# Patient Record
Sex: Female | Born: 1985 | Race: Black or African American | Hispanic: No | Marital: Married | Smoking: Current some day smoker
Health system: Southern US, Community
[De-identification: ages and names within clinical notes are randomized; demographics above are authoritative.]

## PROBLEM LIST (undated history)

## (undated) HISTORY — PX: GASTRIC BYPASS: SHX52

## (undated) HISTORY — PX: OTHER SURGICAL HISTORY: SHX169

---

## 2014-08-27 ENCOUNTER — Emergency Department (HOSPITAL_COMMUNITY)
Admission: EM | Admit: 2014-08-27 | Discharge: 2014-08-27 | Disposition: A | Discharge status: Home / Self Care | Payer: Self-pay | Attending: Emergency Medicine | Admitting: Emergency Medicine

## 2014-08-27 ENCOUNTER — Emergency Department (HOSPITAL_COMMUNITY): Payer: Self-pay

## 2014-08-27 ENCOUNTER — Encounter (HOSPITAL_COMMUNITY): Payer: Self-pay | Admitting: Radiology

## 2014-08-27 DIAGNOSIS — Y929 Unspecified place or not applicable: Secondary | ICD-10-CM | POA: Insufficient documentation

## 2014-08-27 DIAGNOSIS — H9319 Tinnitus, unspecified ear: Secondary | ICD-10-CM | POA: Insufficient documentation

## 2014-08-27 DIAGNOSIS — M791 Myalgia: Secondary | ICD-10-CM | POA: Insufficient documentation

## 2014-08-27 DIAGNOSIS — Y939 Activity, unspecified: Secondary | ICD-10-CM | POA: Insufficient documentation

## 2014-08-27 DIAGNOSIS — R5383 Other fatigue: Secondary | ICD-10-CM | POA: Insufficient documentation

## 2014-08-27 DIAGNOSIS — W2209XA Striking against other stationary object, initial encounter: Secondary | ICD-10-CM | POA: Insufficient documentation

## 2014-08-27 DIAGNOSIS — R61 Generalized hyperhidrosis: Secondary | ICD-10-CM | POA: Insufficient documentation

## 2014-08-27 DIAGNOSIS — I951 Orthostatic hypotension: Secondary | ICD-10-CM

## 2014-08-27 DIAGNOSIS — N390 Urinary tract infection, site not specified: Secondary | ICD-10-CM | POA: Insufficient documentation

## 2014-08-27 DIAGNOSIS — R531 Weakness: Secondary | ICD-10-CM | POA: Insufficient documentation

## 2014-08-27 DIAGNOSIS — R110 Nausea: Secondary | ICD-10-CM | POA: Insufficient documentation

## 2014-08-27 DIAGNOSIS — R42 Dizziness and giddiness: Secondary | ICD-10-CM | POA: Insufficient documentation

## 2014-08-27 DIAGNOSIS — Y999 Unspecified external cause status: Secondary | ICD-10-CM | POA: Insufficient documentation

## 2014-08-27 DIAGNOSIS — S0081XA Abrasion of other part of head, initial encounter: Secondary | ICD-10-CM | POA: Insufficient documentation

## 2014-08-27 DIAGNOSIS — S199XXA Unspecified injury of neck, initial encounter: Secondary | ICD-10-CM | POA: Insufficient documentation

## 2014-08-27 DIAGNOSIS — Z3202 Encounter for pregnancy test, result negative: Secondary | ICD-10-CM | POA: Insufficient documentation

## 2014-08-27 DIAGNOSIS — R55 Syncope and collapse: Secondary | ICD-10-CM | POA: Insufficient documentation

## 2014-08-27 DIAGNOSIS — R630 Anorexia: Secondary | ICD-10-CM | POA: Insufficient documentation

## 2014-08-27 LAB — CBC WITH DIFFERENTIAL/PLATELET
BASOS ABS: 0 10*3/uL (ref 0.0–0.1)
BASOS PCT: 0 % (ref 0–1)
EOS PCT: 0 % (ref 0–5)
Eosinophils Absolute: 0 10*3/uL (ref 0.0–0.7)
HEMATOCRIT: 37.3 % (ref 36.0–46.0)
HEMOGLOBIN: 11.7 g/dL — AB (ref 12.0–15.0)
Lymphocytes Relative: 17 % (ref 12–46)
Lymphs Abs: 1.4 10*3/uL (ref 0.7–4.0)
MCH: 24.3 pg — ABNORMAL LOW (ref 26.0–34.0)
MCHC: 31.4 g/dL (ref 30.0–36.0)
MCV: 77.5 fL — ABNORMAL LOW (ref 78.0–100.0)
Monocytes Absolute: 0.6 10*3/uL (ref 0.1–1.0)
Monocytes Relative: 7 % (ref 3–12)
Neutro Abs: 6.5 10*3/uL (ref 1.7–7.7)
Neutrophils Relative %: 76 % (ref 43–77)
Platelets: 309 10*3/uL (ref 150–400)
RBC: 4.81 MIL/uL (ref 3.87–5.11)
RDW: 14.8 % (ref 11.5–15.5)
WBC: 8.5 10*3/uL (ref 4.0–10.5)

## 2014-08-27 LAB — URINE MICROSCOPIC-ADD ON: RBC / HPF: NONE SEEN RBC/hpf (ref <3)

## 2014-08-27 LAB — COMPREHENSIVE METABOLIC PANEL
ALBUMIN: 3.7 g/dL (ref 3.5–5.0)
ALT: 21 U/L (ref 14–54)
ANION GAP: 15 (ref 5–15)
AST: 24 U/L (ref 15–41)
Alkaline Phosphatase: 64 U/L (ref 38–126)
CHLORIDE: 105 mmol/L (ref 101–111)
CO2: 20 mmol/L — ABNORMAL LOW (ref 22–32)
Calcium: 8.8 mg/dL — ABNORMAL LOW (ref 8.9–10.3)
Creatinine, Ser: 0.86 mg/dL (ref 0.44–1.00)
GFR calc Af Amer: >60 mL/min (ref >60)
GLUCOSE: 98 mg/dL (ref 65–99)
Potassium: 4.3 mmol/L (ref 3.5–5.1)
SODIUM: 140 mmol/L (ref 135–145)
Total Bilirubin: 0.4 mg/dL (ref 0.3–1.2)
Total Protein: 7.4 g/dL (ref 6.5–8.1)

## 2014-08-27 LAB — URINALYSIS, ROUTINE W REFLEX MICROSCOPIC
BILIRUBIN URINE: NEGATIVE
Glucose, UA: NEGATIVE mg/dL
Ketones, ur: 40 mg/dL — AB
Nitrite: POSITIVE — AB
Protein, ur: NEGATIVE mg/dL
Specific Gravity, Urine: 1.021 (ref 1.005–1.030)
UROBILINOGEN UA: 0.2 mg/dL (ref 0.0–1.0)
pH: 6 (ref 5.0–8.0)

## 2014-08-27 LAB — RAPID URINE DRUG SCREEN, HOSP PERFORMED
Amphetamines: NOT DETECTED
BARBITURATES: NOT DETECTED
Benzodiazepines: NOT DETECTED
Cocaine: NOT DETECTED
OPIATES: NOT DETECTED
TETRAHYDROCANNABINOL: NOT DETECTED

## 2014-08-27 LAB — ETHANOL: Alcohol, Ethyl (B): 116 mg/dL — ABNORMAL HIGH (ref <5)

## 2014-08-27 LAB — POC URINE PREG, ED: Preg Test, Ur: NEGATIVE

## 2014-08-27 LAB — TROPONIN I: Troponin I: <0.03 ng/mL (ref <0.031)

## 2014-08-27 MED ORDER — SODIUM CHLORIDE 0.9 % IV BOLUS (SEPSIS)
1000.0000 mL | Freq: Once | INTRAVENOUS | Status: AC
  Administered 2014-08-27: 1000 mL via INTRAVENOUS

## 2014-08-27 MED ORDER — METOCLOPRAMIDE HCL 5 MG/ML IJ SOLN
10.0000 mg | Freq: Once | INTRAMUSCULAR | Status: AC
  Administered 2014-08-27: 10 mg via INTRAVENOUS
  Filled 2014-08-27: qty 2

## 2014-08-27 MED ORDER — DEXTROSE 5 % IV SOLN
1.0000 g | Freq: Once | INTRAVENOUS | Status: AC
  Administered 2014-08-27: 1 g via INTRAVENOUS
  Filled 2014-08-27: qty 10

## 2014-08-27 MED ORDER — DIPHENHYDRAMINE HCL 50 MG/ML IJ SOLN
25.0000 mg | Freq: Once | INTRAMUSCULAR | Status: AC
  Administered 2014-08-27: 25 mg via INTRAVENOUS
  Filled 2014-08-27: qty 1

## 2014-08-27 MED ORDER — CEPHALEXIN 500 MG PO CAPS: 500.0000 mg | ORAL_CAPSULE | Freq: Four times a day (QID) | ORAL | Status: DC

## 2014-08-27 NOTE — Discharge Instructions (Signed)
Near-Syncope °Keep yourself hydrated. Follow up with your doctor. Return to the ED if you develop new or worsening symptoms. °Near-syncope (commonly known as near fainting) is sudden weakness, dizziness, or feeling like you might pass out. During an episode of near-syncope, you may also develop pale skin, have tunnel vision, or feel sick to your stomach (nauseous). Near-syncope may occur when getting up after sitting or while standing for a long time. It is caused by a sudden decrease in blood flow to the brain. This decrease can result from various causes or triggers, most of which are not serious. However, because near-syncope can sometimes be a sign of something serious, a medical evaluation is required. The specific cause is often not determined. °HOME CARE INSTRUCTIONS  °Monitor your condition for any changes. The following actions may help to alleviate any discomfort you are experiencing: °· Have someone stay with you until you feel stable. °· Lie down right away and prop your feet up if you start feeling like you might faint. Breathe deeply and steadily. Wait until all the symptoms have passed. Most of these episodes last only a few minutes. You may feel tired for several hours.   °· Drink enough fluids to keep your urine clear or pale yellow.   °· If you are taking blood pressure or heart medicine, get up slowly when seated or lying down. Take several minutes to sit and then stand. This can reduce dizziness. °· Follow up with your health care provider as directed.  °SEEK IMMEDIATE MEDICAL CARE IF:  °· You have a severe headache.   °· You have unusual pain in the chest, abdomen, or back.   °· You are bleeding from the mouth or rectum, or you have black or tarry stool.   °· You have an irregular or very fast heartbeat.   °· You have repeated fainting or have seizure-like jerking during an episode.   °· You faint when sitting or lying down.   °· You have confusion.   °· You have difficulty walking.   °· You  have severe weakness.   °· You have vision problems.   °MAKE SURE YOU:  °· Understand these instructions. °· Will watch your condition. °· Will get help right away if you are not doing well or get worse. °Document Released: 03/19/2005 Document Revised: 03/24/2013 Document Reviewed: 08/22/2012 °[employer]® Patient Information ©2015 [employer]. This information is not intended to replace advice given to you by your health care provider. Make sure you discuss any questions you have with your health care provider. ° °

## 2014-08-27 NOTE — ED Notes (Signed)
Patient transported to CT 

## 2014-08-27 NOTE — ED Notes (Signed)
SHE GOT VERY DIZZY WHEN SHE STOOD UP FOR ORTHOSTATS.  I HAD TO SIT HER BACK DOWN BEFORE THE PRESSURE FINISHED TAKING.
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
## 2014-08-27 NOTE — ED Notes (Signed)
Pt ambulated to bedside commode. Pt c/o dizziness.

## 2014-08-27 NOTE — ED Notes (Signed)
MD at bedside. 
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
## 2014-08-27 NOTE — ED Notes (Signed)
Pt given fluids and tolerated.

## 2014-08-27 NOTE — ED Notes (Signed)
Pt returned from CT °

## 2014-08-27 NOTE — ED Provider Notes (Signed)
CSN: [id number]     Arrival date & time 08/27/14  1057 History   First MD Initiated Contact with Patient 08/27/14 1105     Chief Complaint  Patient presents with  . Loss of Consciousness     (Consider location/radiation/quality/duration/timing/severity/associated sxs/prior Treatment) HPI Comments: Patient presents by EMS with episode of syncope that occurred around 10 AM. She went to work at [employer] and she was standing at the computer when she had some nausea, lightheadedness, tunnel vision and ringing in her ears. She then fell to her knees and reportedly lost consciousness. She denies hitting her head did strike her chin on the countertop. She denies any chest pain or shortness of breath. On EMS arrival, patient was pale and diaphoretic with blood pressure of 80 systolic. Patient endorses drinking some alcohol last night but states she's been eating and drinking normally. Denies any fevers or chills. No vomiting. No diarrhea. States she has passed out previously and diagnosed with vasovagal syncope. Also states that she had a miscarriage in April at [redacted] weeks gestation and has had ongoing vaginal bleeding since then. She denies any abdominal pain or pelvic pain currently.  Patient states she's had a "migraine" for the past 3 days which actually felt better this morning. She denies sudden worsening of the headache prior to syncope today. Headache gradually onset 3 days ago was associated with photophobia and nausea and vomiting yesterday. It improved with ibuprofen.  The history is provided by the patient and the EMS personnel. The history is limited by the condition of the patient.    History reviewed. No pertinent past medical history. History reviewed. No pertinent past surgical history. History reviewed. No pertinent family history. History  Substance Use Topics  . Smoking status: Not on file  . Smokeless tobacco: Not on file  . Alcohol Use: Not on file   OB History    No data  available     Review of Systems  Constitutional: Positive for activity change, appetite change and fatigue. Negative for fever.  HENT: Negative for congestion and rhinorrhea.   Eyes: Negative for visual disturbance.  Respiratory: Negative for cough, chest tightness and shortness of breath.   Cardiovascular: Positive for syncope. Negative for chest pain.  Gastrointestinal: Positive for nausea. Negative for vomiting and abdominal pain.  Genitourinary: Negative for dysuria, hematuria, vaginal bleeding and vaginal discharge.  Musculoskeletal: Positive for myalgias and arthralgias. Negative for back pain.  Skin: Negative for wound.  Neurological: Positive for dizziness, weakness and light-headedness. Negative for headaches.  A complete 10 system review of systems was obtained and all systems are negative except as noted in the HPI and PMH.      Allergies  Naprosyn  Home Medications   Prior to Admission medications   Medication Sig Start Date End Date Taking? Authorizing Provider  aspirin-acetaminophen-caffeine (EXCEDRIN MIGRAINE) [phone] MG per tablet Take 2 tablets by mouth every 6 (six) hours as needed for headache.   Yes Historical Provider, MD  ibuprofen (ADVIL,MOTRIN) 800 MG tablet Take 800 mg by mouth every 8 (eight) hours as needed (pain).   Yes Historical Provider, MD  cephALEXin (KEFLEX) 500 MG capsule Take 1 capsule (500 mg total) by mouth 4 (four) times daily. 08/27/14   Glynn OctaveStephen Sanjeev Main, MD   BP 105/53 mmHg  Pulse 94  Temp(Src) 98.7 F (37.1 C) (Oral)  Resp 26  SpO2 99%  LMP 08/27/2014 Physical Exam  Constitutional: She is oriented to person, place, and time. She appears well-developed and well-nourished.  No distress.  HENT:  Head: Normocephalic and atraumatic.  Mouth/Throat: Oropharynx is clear and moist. No oropharyngeal exudate.  Abrasion to chin. No trismus. No malocclusion.  Eyes: Conjunctivae and EOM are normal. Pupils are equal, round, and reactive to light.   Neck: Normal range of motion. Neck supple.  Diffuse paraspinal neck tenderness  Cardiovascular: Normal rate, regular rhythm, normal heart sounds and intact distal pulses.   No murmur heard. Pulmonary/Chest: Effort normal and breath sounds normal. No respiratory distress.  Abdominal: Soft. There is no tenderness. There is no rebound and no guarding.  Musculoskeletal: Normal range of motion. She exhibits no edema or tenderness.  Neurological: She is alert and oriented to person, place, and time. No cranial nerve deficit. She exhibits normal muscle tone. Coordination normal.  No ataxia on finger to nose bilaterally. No pronator drift. 4/5 strength throughout with poor effort. CN 2-12 intact.  Equal grip strength. Sensation intact.   Skin: Skin is warm.  Psychiatric: She has a normal mood and affect. Her behavior is normal.  Nursing note and vitals reviewed.   ED Course  Procedures (including critical care time) Labs Review Labs Reviewed  URINALYSIS, ROUTINE W REFLEX MICROSCOPIC (NOT AT [facility]) - Abnormal; Notable for the following:    Color, Urine AMBER (*)    APPearance CLOUDY (*)    Hgb urine dipstick LARGE (*)    Ketones, ur 40 (*)    Nitrite POSITIVE (*)    Leukocytes, UA SMALL (*)    All other components within normal limits  CBC WITH DIFFERENTIAL/PLATELET - Abnormal; Notable for the following:    Hemoglobin 11.7 (*)    MCV 77.5 (*)    MCH 24.3 (*)    All other components within normal limits  COMPREHENSIVE METABOLIC PANEL - Abnormal; Notable for the following:    CO2 20 (*)    BUN <5 (*)    Calcium 8.8 (*)    All other components within normal limits  ETHANOL - Abnormal; Notable for the following:    Alcohol, Ethyl (B) 116 (*)    All other components within normal limits  URINE MICROSCOPIC-ADD ON - Abnormal; Notable for the following:    Squamous Epithelial / LPF MANY (*)    Bacteria, UA MANY (*)    All other components within normal limits  URINE CULTURE  TROPONIN I   URINE RAPID DRUG SCREEN (HOSP PERFORMED) NOT AT [facility]  POC URINE PREG, ED    Imaging Review Ct Head Wo Contrast  08/27/2014   CLINICAL DATA:  Syncope.  Fall.  EXAM: CT HEAD WITHOUT CONTRAST  CT MAXILLOFACIAL WITHOUT CONTRAST  CT CERVICAL SPINE WITHOUT CONTRAST  TECHNIQUE: Multidetector CT imaging of the head, cervical spine, and maxillofacial structures were performed using the standard protocol without intravenous contrast. Multiplanar CT image reconstructions of the cervical spine and maxillofacial structures were also generated.  COMPARISON:  None.  FINDINGS: CT HEAD FINDINGS  No acute cortical infarct, hemorrhage, or mass lesion ispresent. Ventricles are of normal size. No significant extra-axial fluid collection is present. The paranasal sinuses andmastoid air cells are clear. The osseous skull is intact.  CT MAXILLOFACIAL FINDINGS  The paranasal sinuses are clear. The orbits are intact. No evidence for blow-out fracture. The mandible appears located and intact. The maxilla is also located and intact. The nasal bone is intact in the nasal septum is midline.  CT CERVICAL SPINE FINDINGS  Reversal of normal cervical lordosis. The cervical vertebral body heights are well preserved. The facet joints  are all well aligned. There is no fracture or subluxation identified.  IMPRESSION: 1. No acute intracranial abnormality. 2. No evidence for cervical spine fracture or dislocation. 3. Negative for facial bone injury.   Electronically Signed   By: Signa Kell M.D.   On: 08/27/2014 13:18   Ct Cervical Spine Wo Contrast  08/27/2014   CLINICAL DATA:  Syncope.  Fall.  EXAM: CT HEAD WITHOUT CONTRAST  CT MAXILLOFACIAL WITHOUT CONTRAST  CT CERVICAL SPINE WITHOUT CONTRAST  TECHNIQUE: Multidetector CT imaging of the head, cervical spine, and maxillofacial structures were performed using the standard protocol without intravenous contrast. Multiplanar CT image reconstructions of the cervical spine and maxillofacial  structures were also generated.  COMPARISON:  None.  FINDINGS: CT HEAD FINDINGS  No acute cortical infarct, hemorrhage, or mass lesion ispresent. Ventricles are of normal size. No significant extra-axial fluid collection is present. The paranasal sinuses andmastoid air cells are clear. The osseous skull is intact.  CT MAXILLOFACIAL FINDINGS  The paranasal sinuses are clear. The orbits are intact. No evidence for blow-out fracture. The mandible appears located and intact. The maxilla is also located and intact. The nasal bone is intact in the nasal septum is midline.  CT CERVICAL SPINE FINDINGS  Reversal of normal cervical lordosis. The cervical vertebral body heights are well preserved. The facet joints are all well aligned. There is no fracture or subluxation identified.  IMPRESSION: 1. No acute intracranial abnormality. 2. No evidence for cervical spine fracture or dislocation. 3. Negative for facial bone injury.   Electronically Signed   By: Signa Kell M.D.   On: 08/27/2014 13:18   Ct Maxillofacial Wo Cm  08/27/2014   CLINICAL DATA:  Syncope.  Fall.  EXAM: CT HEAD WITHOUT CONTRAST  CT MAXILLOFACIAL WITHOUT CONTRAST  CT CERVICAL SPINE WITHOUT CONTRAST  TECHNIQUE: Multidetector CT imaging of the head, cervical spine, and maxillofacial structures were performed using the standard protocol without intravenous contrast. Multiplanar CT image reconstructions of the cervical spine and maxillofacial structures were also generated.  COMPARISON:  None.  FINDINGS: CT HEAD FINDINGS  No acute cortical infarct, hemorrhage, or mass lesion ispresent. Ventricles are of normal size. No significant extra-axial fluid collection is present. The paranasal sinuses andmastoid air cells are clear. The osseous skull is intact.  CT MAXILLOFACIAL FINDINGS  The paranasal sinuses are clear. The orbits are intact. No evidence for blow-out fracture. The mandible appears located and intact. The maxilla is also located and intact. The nasal  bone is intact in the nasal septum is midline.  CT CERVICAL SPINE FINDINGS  Reversal of normal cervical lordosis. The cervical vertebral body heights are well preserved. The facet joints are all well aligned. There is no fracture or subluxation identified.  IMPRESSION: 1. No acute intracranial abnormality. 2. No evidence for cervical spine fracture or dislocation. 3. Negative for facial bone injury.   Electronically Signed   By: Signa Kell M.D.   On: 08/27/2014 13:18     EKG Interpretation   Date/Time:  Friday Aug 27 2014 11:13:35 EDT Ventricular Rate:  81 PR Interval:  165 QRS Duration: 73 QT Interval:  396 QTC Calculation: 460 R Axis:   46 Text Interpretation:  Sinus rhythm No significant change was found  Confirmed by Manus Gunning  MD, Slayton Lubitz [phone]) on 08/27/2014 11:27:14 AM      MDM   Final diagnoses:  Orthostatic hypotension  Syncope, unspecified syncope type  Urinary tract infection without hematuria, site unspecified   Syncopal episode preceded by lightheadedness  and dizziness. Hypotensive on EMS arrival. Now hypertensive.  Orthostatics positive. Heart rate elevates to 100 with standing.  hemoglobin 11.7 without comparison. HCG negative.  UA with infection.  Culture sent.  EKG nsr. No brugada or prolonged QT.  CT head and C spine negative.  Suspect vasovagal syncope in setting of alcohol use.  IVF and PO fluids given. Treat UTI.  Patient tolerating PO.  Continue IVF and aggressive hydration.  Dr. Madilyn Hook to disposition once able to ambulate.   Glynn Octave, MD 08/27/14 [phone]

## 2014-08-30 LAB — URINE CULTURE: Colony Count: >=100000

## 2014-08-31 ENCOUNTER — Telehealth (HOSPITAL_BASED_OUTPATIENT_CLINIC_OR_DEPARTMENT_OTHER): Payer: Self-pay | Admitting: Emergency Medicine

## 2014-08-31 NOTE — Telephone Encounter (Signed)
Post ED Visit - Positive Culture Follow-up  Culture report reviewed by antimicrobial stewardship pharmacist: []  Wes Dulaney, Pharm.D., BCPS []  Celedonio MiyamotoJeremy Frens, Pharm.D., BCPS []  Georgina PillionElizabeth Martin, [address] BCPS []  [place]fordMinh Pham, [address] BCPS, AAHIVP []  Estella HuskMichelle Turner, Pharm.D., BCPS, AAHIVP []  Elder CyphersLorie Poole, [address] BCPS X Tegan Magsam pharm D Positive urine  Culture Klebsiella Treated with cephalexin, organism sensitive to the same and no further patient follow-up is required at this time.  Berle MullMiller, Cicilia Clinger 08/31/2014, 1:58 PM

## 2015-09-19 ENCOUNTER — Other Ambulatory Visit (HOSPITAL_COMMUNITY): Payer: Self-pay | Admitting: Obstetrics and Gynecology

## 2015-09-19 DIAGNOSIS — Z3141 Encounter for fertility testing: Secondary | ICD-10-CM

## 2015-09-26 ENCOUNTER — Ambulatory Visit (HOSPITAL_COMMUNITY): Payer: Self-pay

## 2017-04-08 ENCOUNTER — Other Ambulatory Visit: Payer: Self-pay

## 2017-04-08 ENCOUNTER — Emergency Department (HOSPITAL_COMMUNITY)
Admission: EM | Admit: 2017-04-08 | Discharge: 2017-04-09 | Disposition: A | Discharge status: Home / Self Care | Payer: BLUE CROSS/BLUE SHIELD | Attending: Emergency Medicine | Admitting: Emergency Medicine

## 2017-04-08 ENCOUNTER — Encounter (HOSPITAL_COMMUNITY): Payer: Self-pay | Admitting: Emergency Medicine

## 2017-04-08 ENCOUNTER — Emergency Department (HOSPITAL_COMMUNITY): Payer: BLUE CROSS/BLUE SHIELD

## 2017-04-08 DIAGNOSIS — F419 Anxiety disorder, unspecified: Secondary | ICD-10-CM

## 2017-04-08 DIAGNOSIS — F17210 Nicotine dependence, cigarettes, uncomplicated: Secondary | ICD-10-CM | POA: Insufficient documentation

## 2017-04-08 DIAGNOSIS — R251 Tremor, unspecified: Secondary | ICD-10-CM

## 2017-04-08 DIAGNOSIS — R112 Nausea with vomiting, unspecified: Secondary | ICD-10-CM | POA: Diagnosis not present

## 2017-04-08 DIAGNOSIS — R197 Diarrhea, unspecified: Secondary | ICD-10-CM | POA: Diagnosis not present

## 2017-04-08 DIAGNOSIS — R1012 Left upper quadrant pain: Secondary | ICD-10-CM | POA: Diagnosis not present

## 2017-04-08 DIAGNOSIS — K760 Fatty (change of) liver, not elsewhere classified: Secondary | ICD-10-CM

## 2017-04-08 DIAGNOSIS — F1010 Alcohol abuse, uncomplicated: Secondary | ICD-10-CM | POA: Insufficient documentation

## 2017-04-08 LAB — CBC
HCT: 37.1 % (ref 36.0–46.0)
Hemoglobin: 12.2 g/dL (ref 12.0–15.0)
MCH: 26.4 pg (ref 26.0–34.0)
MCHC: 32.9 g/dL (ref 30.0–36.0)
MCV: 80.3 fL (ref 78.0–100.0)
PLATELETS: 349 10*3/uL (ref 150–400)
RBC: 4.62 MIL/uL (ref 3.87–5.11)
RDW: 16 % — AB (ref 11.5–15.5)
WBC: 11.2 10*3/uL — AB (ref 4.0–10.5)

## 2017-04-08 LAB — ETHANOL

## 2017-04-08 LAB — URINALYSIS, ROUTINE W REFLEX MICROSCOPIC
BILIRUBIN URINE: NEGATIVE
GLUCOSE, UA: NEGATIVE mg/dL
Ketones, ur: NEGATIVE mg/dL
NITRITE: NEGATIVE
PH: 7 (ref 5.0–8.0)
Protein, ur: NEGATIVE mg/dL
SPECIFIC GRAVITY, URINE: 1.011 (ref 1.005–1.030)

## 2017-04-08 LAB — COMPREHENSIVE METABOLIC PANEL
ALK PHOS: 65 U/L (ref 38–126)
ALT: 21 U/L (ref 14–54)
AST: 30 U/L (ref 15–41)
Albumin: 3.8 g/dL (ref 3.5–5.0)
Anion gap: 11 (ref 5–15)
BUN: 6 mg/dL (ref 6–20)
CALCIUM: 8.6 mg/dL — AB (ref 8.9–10.3)
CO2: 21 mmol/L — ABNORMAL LOW (ref 22–32)
CREATININE: 0.66 mg/dL (ref 0.44–1.00)
Chloride: 98 mmol/L — ABNORMAL LOW (ref 101–111)
Glucose, Bld: 112 mg/dL — ABNORMAL HIGH (ref 65–99)
Potassium: 3.5 mmol/L (ref 3.5–5.1)
Sodium: 130 mmol/L — ABNORMAL LOW (ref 135–145)
Total Bilirubin: 0.8 mg/dL (ref 0.3–1.2)
Total Protein: 7.5 g/dL (ref 6.5–8.1)

## 2017-04-08 LAB — RAPID URINE DRUG SCREEN, HOSP PERFORMED
AMPHETAMINES: NOT DETECTED
Barbiturates: NOT DETECTED
Benzodiazepines: NOT DETECTED
Cocaine: NOT DETECTED
OPIATES: NOT DETECTED
Tetrahydrocannabinol: NOT DETECTED

## 2017-04-08 LAB — I-STAT BETA HCG BLOOD, ED (MC, WL, AP ONLY): I-stat hCG, quantitative: <5 m[IU]/mL (ref <5)

## 2017-04-08 LAB — LIPASE, BLOOD: Lipase: 28 U/L (ref 11–51)

## 2017-04-08 MED ORDER — LORAZEPAM 2 MG/ML IJ SOLN: 1.0000 mg | Freq: Once | INTRAMUSCULAR | Status: DC

## 2017-04-08 MED ORDER — ONDANSETRON HCL 4 MG/2ML IJ SOLN
4.0000 mg | Freq: Once | INTRAMUSCULAR | Status: AC
Start: 2017-04-08 — End: 2017-04-08
  Administered 2017-04-08: 4 mg via INTRAVENOUS
  Filled 2017-04-08: qty 2

## 2017-04-08 MED ORDER — LORAZEPAM 1 MG PO TABS: 0.0000 mg | ORAL_TABLET | Freq: Four times a day (QID) | ORAL | Status: DC

## 2017-04-08 MED ORDER — SODIUM CHLORIDE 0.9 % IV BOLUS (SEPSIS)
1000.0000 mL | Freq: Once | INTRAVENOUS | Status: AC
  Administered 2017-04-08: 1000 mL via INTRAVENOUS

## 2017-04-08 MED ORDER — LORAZEPAM 2 MG/ML IJ SOLN
1.0000 mg | Freq: Once | INTRAMUSCULAR | Status: AC
  Administered 2017-04-08: 1 mg via INTRAVENOUS
  Filled 2017-04-08: qty 1

## 2017-04-08 MED ORDER — THIAMINE HCL 100 MG/ML IJ SOLN: 100.0000 mg | Freq: Every day | INTRAMUSCULAR | Status: DC

## 2017-04-08 MED ORDER — LORAZEPAM 2 MG/ML IJ SOLN
0.0000 mg | Freq: Four times a day (QID) | INTRAMUSCULAR | Status: DC
  Administered 2017-04-08: 2 mg via INTRAVENOUS
  Filled 2017-04-08: qty 1

## 2017-04-08 MED ORDER — IOPAMIDOL (ISOVUE-300) INJECTION 61%
100.0000 mL | Freq: Once | INTRAVENOUS | Status: AC | PRN
  Administered 2017-04-08: 100 mL via INTRAVENOUS

## 2017-04-08 MED ORDER — IOPAMIDOL (ISOVUE-300) INJECTION 61%
INTRAVENOUS | Status: AC
  Filled 2017-04-08: qty 100

## 2017-04-08 MED ORDER — LORAZEPAM 2 MG/ML IJ SOLN: 0.0000 mg | Freq: Two times a day (BID) | INTRAMUSCULAR | Status: DC

## 2017-04-08 MED ORDER — VITAMIN B-1 100 MG PO TABS: 100.0000 mg | ORAL_TABLET | Freq: Every day | ORAL | Status: DC

## 2017-04-08 MED ORDER — ONDANSETRON HCL 4 MG/2ML IJ SOLN
4.0000 mg | Freq: Once | INTRAMUSCULAR | Status: AC
  Administered 2017-04-08: 4 mg via INTRAVENOUS
  Filled 2017-04-08: qty 2

## 2017-04-08 MED ORDER — LORAZEPAM 1 MG PO TABS: 0.0000 mg | ORAL_TABLET | Freq: Two times a day (BID) | ORAL | Status: DC

## 2017-04-08 NOTE — ED Notes (Signed)
ED Provider at bedside. 

## 2017-04-08 NOTE — ED Provider Notes (Signed)
[facility]   CSN: [id number] Arrival date & time: 04/08/17  1930     History   Chief Complaint Chief Complaint  Patient presents with  . Delirium Tremens (DTS)  . Alcohol Problem  . Depression    HPI Holly Collier is a 32 y.o. female.  HPI   32 y/o female presents to the ED today accompanied by fiance with a chief complaint of DTs. Pt states she has a h/o intermittent ETOH abuse since 03-21-2012 when her mother passed away. Most recent episode of drinking started on 1/3 due to increased stress about her mother's passing and family issues that were causing her to have panic attacks. Has been drinking 4-5 bottles of wine a day since then. Has not had a drink since yesterday. Today is c/o tremulousness, diffuse HA, and palpitations. Pt also reports LUQ abd pain that has been intermittent over the last several days and is now constant. She also reports nausea and diarrhea for several days. Started vomiting today and has had about 5 episodes. States she has not had a full meal since 1/5 and has decreased intake of liquids as well.   Pt fell and hit head on a coffee table a few days ago. Fall was witnessed by fiance and he states pt did not lose consciousness. Pt denies any other injuries from the fall. Pt denies SI/HI, hallucinations, vision changes, chest pain, SOB, blood in stool, back pain, neck pain, or urinary sxs. Pt currently menstruating.   Pts fiance reports a possible seizure that occurred in the waiting room when pt lost consciousness for about 1 minute and began shaking. She regained consciousness and was mildly groggy, but returned to her baseline mental state after about 5 minutes. Pt denies tongue biting or urinary incontinence. She reports a prodrome of lightheadedness and tinnitus prior to the episode.  She denies a h/o DTs or seizures from ETOH withdrawal. Not currently being tx for anxiety/depression. No calf swelling,  redness, pain. No h/o hormone use, recent surgeries, prolonged immobility. No personal or family h/o VTE. No personal h/o CA.   History reviewed. No pertinent past medical history.  There are no active problems to display for this patient.   Past Surgical History:  Procedure Laterality Date  . GASTRIC BYPASS    . shoulder Right     OB History    No data available       Home Medications    Prior to Admission medications   Medication Sig Start Date End Date Taking? Authorizing Provider  aspirin-acetaminophen-caffeine (EXCEDRIN MIGRAINE) [phone] MG per tablet Take 2 tablets by mouth every 6 (six) hours as needed for headache.   Yes [provider]  ibuprofen (ADVIL,MOTRIN) 800 MG tablet Take 800 mg by mouth every 8 (eight) hours as needed (pain).   Yes [provider]  LORazepam (ATIVAN) 1 MG tablet Take 1 tablet (1 mg total) by mouth every 4 (four) hours as needed for anxiety (Take 1 tablet every 3-4 hours as needed for anxiety or tremors). 04/09/17   Starlette Thurow S, PA-C    Family History History reviewed. No pertinent family history.  Social History Social History   Tobacco Use  . Smoking status: Current Some Day Smoker    Packs/day: 0.50    Types: Cigarettes  . Smokeless tobacco: Never Used  Substance Use Topics  . Alcohol use: Yes    Comment: 5-6 bottles of wine  . Drug use: No  Allergies   Naprosyn [naproxen]   Review of Systems Review of Systems  Constitutional: Positive for chills. Negative for fever.       Tremulousness  HENT: Negative for ear pain and sore throat.   Eyes: Negative for pain and visual disturbance.  Respiratory: Negative for cough, shortness of breath and wheezing.   Cardiovascular: Negative for chest pain and palpitations.  Gastrointestinal: Positive for abdominal pain, diarrhea, nausea and vomiting. Negative for constipation.  Genitourinary: Positive for vaginal bleeding. Negative for dysuria, flank pain,  frequency, hematuria and urgency.  Musculoskeletal: Negative for arthralgias, back pain and neck pain.       No bilat calf pain, swelling, redness  Skin: Negative for color change and rash.  Neurological: Positive for light-headedness and headaches.       LOC  Psychiatric/Behavioral: Positive for dysphoric mood. Negative for hallucinations and self-injury. The patient is nervous/anxious.   All other systems reviewed and are negative.    Physical Exam Updated Vital Signs BP 121/78 (BP Location: Left Arm)   Pulse (!) 107   Temp 98.8 F (37.1 C) (Oral)   Resp (!) 28   Ht 5\' 8"  (1.727 m)   Wt (!) 144.7 kg (319 lb)   LMP 04/05/2017 (Exact Date)   SpO2 94%   BMI 48.50 kg/m   Physical Exam  Constitutional: She appears well-developed and well-nourished. She appears distressed.  Tremulous on exam  HENT:  Head: Normocephalic and atraumatic.  No battle signs, no raccoons eyes, no rhinorrhea. No deformity or crepitus noted.  Eyes: Conjunctivae and EOM are normal. Pupils are equal, round, and reactive to light.  Neck: Normal range of motion. Neck supple.  Cardiovascular: Regular rhythm, normal heart sounds and intact distal pulses.  No murmur heard. Mildly tachycardic  Pulmonary/Chest: Effort normal and breath sounds normal. No respiratory distress. She has no wheezes.  Abdominal: Soft. Bowel sounds are normal. She exhibits no distension. There is tenderness (LUQ). There is guarding.  Musculoskeletal: She exhibits no edema.  No TTP to the cervical, thoracic, or lumbar spine. No pain to the paraspinous muscles. No bilat calf swelling, erythema, TTP.  Neurological: She is alert.  Mental Status:  Alert, thought content appropriate, able to give a coherent history. Speech fluent without evidence of aphasia. Able to follow 2 step commands without difficulty.  Cranial Nerves:  II:  Peripheral visual fields grossly normal, pupils equal, round, reactive to light III,IV, VI: ptosis not  present, extra-ocular motions intact bilaterally  V,VII: smile symmetric, facial light touch sensation equal VIII: hearing grossly normal to voice  X: uvula elevates symmetrically  XI: bilateral shoulder shrug symmetric and strong Motor:  Normal tone. 5/5 strength of BUE and BLE major muscle groups including strong and equal grip strength and dorsiflexion/plantar flexion Sensory: light touch normal in all extremities. CV:  DP/PT pulses Ambulatory  Skin: Skin is warm and dry.  Psychiatric: She has a normal mood and affect.  Nursing note and vitals reviewed.    ED Treatments / Results  Labs (all labs ordered are listed, but only abnormal results are displayed) Labs Reviewed  COMPREHENSIVE METABOLIC PANEL - Abnormal; Notable for the following components:      Result Value   Sodium 130 (*)    Chloride 98 (*)    CO2 21 (*)    Glucose, Bld 112 (*)    Calcium 8.6 (*)    All other components within normal limits  CBC - Abnormal; Notable for the following components:   WBC 11.2 (*)  RDW 16.0 (*)    All other components within normal limits  URINALYSIS, ROUTINE W REFLEX MICROSCOPIC - Abnormal; Notable for the following components:   Hgb urine dipstick LARGE (*)    Leukocytes, UA MODERATE (*)    Bacteria, UA RARE (*)    Squamous Epithelial / LPF 0-5 (*)    All other components within normal limits  ETHANOL  RAPID URINE DRUG SCREEN, HOSP PERFORMED  LIPASE, BLOOD  I-STAT BETA HCG BLOOD, ED ([facility], [facility], [facility] ONLY)    EKG  EKG Interpretation  Date/Time:  Monday April 08 2017 21:36:01 EST Ventricular Rate:  100 PR Interval:    QRS Duration: 91 QT Interval:  362 QTC Calculation: 467 R Axis:   56 Text Interpretation:  Sinus tachycardia Borderline T abnormalities, anterior leads Since last tracing rate faster Confirmed by Mancel Bale [phone]) on 04/08/2017 10:14:25 PM       Radiology Ct Head Wo Contrast  Result Date: 04/08/2017 CLINICAL DATA:  Headache, post traumatic.  Nausea  and vomiting. EXAM: CT HEAD WITHOUT CONTRAST TECHNIQUE: Contiguous axial images were obtained from the base of the skull through the vertex without intravenous contrast. COMPARISON:  None. FINDINGS: Brain: No intracranial hemorrhage, mass effect, or midline shift. No hydrocephalus. The basilar cisterns are patent. No evidence of territorial infarct or acute ischemia. No extra-axial or intracranial fluid collection. Vascular: No hyperdense vessel or unexpected calcification. Skull: No fracture or focal lesion. Sinuses/Orbits: Paranasal sinuses and mastoid air cells are clear. The visualized orbits are unremarkable. Other: None. IMPRESSION: Normal noncontrast head CT. Electronically Signed   By: Rubye Oaks M.D.   On: 04/08/2017 22:39   Ct Abdomen Pelvis W Contrast  Result Date: 04/08/2017 CLINICAL DATA:  Nausea and vomiting.  Alcohol abuse. EXAM: CT ABDOMEN AND PELVIS WITH CONTRAST TECHNIQUE: Multidetector CT imaging of the abdomen and pelvis was performed using the standard protocol following bolus administration of intravenous contrast. CONTRAST:  ISOVUE-300 IOPAMIDOL (ISOVUE-300) INJECTION 61% COMPARISON:  None. FINDINGS: Lower chest:  No contributory findings. Hepatobiliary: Prominent hepatic steatosis.No evidence of biliary obstruction or stone. Pancreas: Unremarkable. Spleen: Unremarkable. Adrenals/Urinary Tract: Negative adrenals. No hydronephrosis or stone. Unremarkable bladder. Stomach/Bowel: No obstruction. No inflammatory changes. Gastric bypass without complicating feature. Vascular/Lymphatic: No vascular findings.  No mass or adenopathy. Reproductive:No pathologic findings. Other: No ascites or pneumoperitoneum. Musculoskeletal: No acute abnormalities. IMPRESSION: 1. No acute finding. 2. Hepatic steatosis. Electronically Signed   By: Marnee Spring M.D.   On: 04/08/2017 22:38    Procedures Procedures (including critical care time)  Medications Ordered in ED Medications  LORazepam  (ATIVAN) injection 0-4 mg (2 mg Intravenous Given 04/08/17 2312)    Or  LORazepam (ATIVAN) tablet 0-4 mg ( Oral See Alternative 04/08/17 2312)  LORazepam (ATIVAN) injection 0-4 mg (not administered)    Or  LORazepam (ATIVAN) tablet 0-4 mg (not administered)  thiamine (VITAMIN B-1) tablet 100 mg (not administered)    Or  thiamine (B-1) injection 100 mg (not administered)  iopamidol (ISOVUE-300) 61 % injection (not administered)  sodium chloride 0.9 % bolus 1,000 mL (0 mLs Intravenous Stopped 04/08/17 2200)  ondansetron (ZOFRAN) injection 4 mg (4 mg Intravenous Given 04/08/17 2136)  LORazepam (ATIVAN) injection 1 mg (1 mg Intravenous Given 04/08/17 2136)  iopamidol (ISOVUE-300) 61 % injection 100 mL (100 mLs Intravenous Contrast Given 04/08/17 2215)  sodium chloride 0.9 % bolus 1,000 mL (1,000 mLs Intravenous New Bag/Given 04/08/17 2255)  ondansetron (ZOFRAN) injection 4 mg (4 mg Intravenous Given 04/08/17 2311)  Initial Impression / Assessment and Plan / ED Course  I have reviewed the triage vital signs and the nursing notes.  Pertinent labs & imaging results that were available during my care of the patient were reviewed by me and considered in my medical decision making (see chart for details).  Staffed pt with Dr. Effie ShyWentz who evaluated pt and agrees with the plan.   10:43 PM Pt walking to the bathroom.   11:16: Rechecked pt. She is still c/o mild nausea. BP 130s systolic. HR 110s.  Nurse giving additional ativan and zofran. Abdomen soft and nontender. No guarding. Discussed the results of the labs and imaging.   11:45 Rechecked pt. She is sleeping in bed comfortably. HR 108 on monitor. BP 126/83. CIWA was repeated by nursing while I was at the bedside and was 6. Pt lucid with mild tremulousness of fingers.  Dr. Effie ShyWentz evaluated the patient and believes that she is stable for discharge with a short course of Ativan. He discussed the plan for discharge with the patient and her fiance. They are  comfortable with the plan for discharge with Ativan.   Final Clinical Impressions(s) / ED Diagnoses   Final diagnoses:  Non-intractable vomiting with nausea, unspecified vomiting type  Anxiety  Diarrhea, unspecified type  Alcohol abuse  Hepatic steatosis  Tremulousness    32 y/o F presenting with a chief complaint of DTs. On my evaluation in the ED, pt does not appear to be in DTs. She may have a component of mild ETOH withdrawal, but I believe that a major cause of her symptoms are her increased anxiety/panic attacks and dehydration from decreased PO intake, NVD, and heavy ETOH use. Her CIWA score was initially 26, but has trended down and was 6 on last exam with nursing.  Last drink was 24 hours ago and ETOH was negative. She does have a h/o ETOH abuse, but denies daily ETOH consumption. States it is intermittent and is mostly binge drinking on the weekends. Most recent binge drinking episode began on 1/3. Drinking precipitated because pt has been having increasing panic attacks and stress about her mother's death.   The episode of LOC that pt reported in the waiting room was likely a syncopal episode related to dehydration and decreased PO intake. No post-ictal state noted by fiance. No seizure activity noted in the ED. CT head negative.  NV resolved with fluids and zofran. No vomiting in ED. Abd soft and normal BS. Likely secondary to ETOH use. CT abd negative for acute abnormality.   Recent fall with head injury and HA. Head CT negative. No other injuries noted from fall requiring workup.   UA likely contaminated. Pt asymptomatic and unlikely has UTI.   TSS saw the pt and will provide resources for mental health counseling. I will also provide these resources at dc.   Pt stable for d/c with short course of ativan and strict return precautions. Advised PCP f/u within 3 days.   ED Discharge Orders        Ordered    LORazepam (ATIVAN) 1 MG tablet  Every 4 hours PRN     04/09/17 0111        Estephani Popper S, PA-C 04/09/17 0149    Mancel BaleWentz, Elliott, MD 04/09/17 1221

## 2017-04-08 NOTE — ED Provider Notes (Signed)
  Face-to-face evaluation   History: Complains of ongoing sadness, anxiety and panic attacks since her mother died 6 years ago.  She does not see a therapist.  She drinks, alcohol, when she gets upset.  Physical exam: Obese, alert, cooperative.  Mild tremor.  No respiratory distress.  She is lucid.  Medical screening examination/treatment/procedure(s) were conducted as a shared visit with non-physician practitioner(s) and myself.  I personally evaluated the patient during the encounter   Reevaluation at 12:39 AM-alert, calm, non-tremorous.  Findings discussed with patient and female person with her and all questions answered.    Mancel BaleWentz, Farhad Burleson, MD 04/09/17 [phone]

## 2017-04-08 NOTE — BH Assessment (Addendum)
Assessment Note   Patient Name: Holly Collier MRN: [id number] Referring Physician:  Location of Patient: [facility] Location of Provider: Behavioral Health TTS Department  Holly Collier is an 32 y.o. female [facility] fiance who participated in the assessment at the Pt's request.  Pt reports symptoms of depression and a drinking episode after having a panic attack. Pt denies SI/HI/AVH.  Pt states her current stressor is her drinking.  Pt states she can go months without drinking and then start back up and drink excessively.  Pt states she drinking heavily over the weekend.  Pt lives with her fiance and reports that he is a support to her.  Pt has fair insight and judgement.  Pt denies using any other substances.  Pt is dressed in a hospital gown, alert and oriented x4 with normal speech and normal motor behavior.  Eye contact is good.  Pt's mood and affect are depressed and affect is congruent with mood.  Thought process is coherent and relevant.  There is no indication Pt is currently responding to internal stimuli or experiencing delusional thought content.  Pt was cooperative throughout the assessment.  Pt is currently able to contract for safety.  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 No pertinent past medical history.  Past Surgical History:  Procedure Laterality Date  . GASTRIC BYPASS    . shoulder Right     Family History: History reviewed. No pertinent family history.  Social History:  reports that she has been smoking cigarettes.  She has been smoking about 0.50 packs per day. she has never used smokeless tobacco. She reports that she drinks alcohol. She reports that she does not use drugs.  Additional Social History:     CIWA: CIWA-Ar BP: 133/87 Pulse Rate: (!) 101 Nausea and Vomiting: 6 Tactile Disturbances: moderate itching,  pins and needles, burning or numbness Tremor: severe, even with arms not extended Auditory Disturbances: not present Paroxysmal Sweats: beads of sweat obvious on forehead Visual Disturbances: not present Anxiety: three Headache, Fullness in Head: moderate Agitation: normal activity Orientation and Clouding of Sensorium: oriented and can do serial additions CIWA-Ar Total: 26 COWS:    PATIENT STRENGTHS: (choose at least two) Ability for insight Active sense of humor Average or above average intelligence Capable of independent living MetallurgistCommunication skills Financial means General fund of knowledge Physical Health Supportive family/friends  Allergies:  Allergies  Allergen Reactions  . Naprosyn [Naproxen] Anaphylaxis    Home Medications:  (Not in a hospital admission)  OB/GYN Status:  Patient's last menstrual period was 04/05/2017 (exact date).  General Assessment Data Location of Assessment: [facility] TTS Assessment: In system Is this a Tele or Face-to-Face Assessment?: Face-to-Face Is this an Initial Assessment or a Re-assessment for this encounter?: Initial Assessment Marital status: Single Maiden name: Terence LuxJackson Collier Is patient pregnant?: No Pregnancy Status: No           Risk to self with the past 6 months Is patient at risk for suicide?: No                                     Advance Directives (For Healthcare) Does Patient Have a Medical Advance Directive?: No          Disposition: Gave clinical report to Donell SievertSpencer Simon, PA, pt doesn't meet criteria for  inpatient psychiatric treatment, recommended discharge with outpatient resources.  Notified Mariane Masters, RN and Dr Effie Shy of recommendations.  Pt has been given OPT provider lists.     Annamaria Boots, MS, [facility] Therapeutic Triage Specialist  Annamaria Boots 04/08/2017 10:42 PM

## 2017-04-08 NOTE — ED Triage Notes (Signed)
Pt family reports that pt has been more anxious than normal and has been drinking in excess over the last week. Pt family reports pt drinks 5-6 bottles of wine a day. Last use was on 04/07/17. Family reports that pt has been vomiting and cold sweats with confusion and shaking.

## 2017-04-08 NOTE — Discharge Instructions (Addendum)
Please follow up with your primary care doctor within 3 days for reevaluation. You were given a prescription for Ativan. You may take 1 tablet every 3-4 hours for anxiety and/or tremors. You should return to the ER immediately if you have any new or worsening symptoms including any worsening tremors, hallucinations, confusion, tingling to the hands, worsening headache, or worsening nausea or vomiting.

## 2017-04-09 MED ORDER — LORAZEPAM 1 MG PO TABS: 1.0000 mg | ORAL_TABLET | ORAL | 0 refills | Status: DC | PRN

## 2017-07-10 ENCOUNTER — Inpatient Hospital Stay (HOSPITAL_COMMUNITY)
Admission: AD | Admit: 2017-07-10 | Discharge: 2017-07-15 | DRG: 885 | Disposition: A | Discharge status: Home / Self Care | Payer: BLUE CROSS/BLUE SHIELD | Source: Intra-hospital | Attending: Psychiatry | Admitting: Psychiatry

## 2017-07-10 ENCOUNTER — Encounter (HOSPITAL_COMMUNITY): Payer: Self-pay | Admitting: *Deleted

## 2017-07-10 ENCOUNTER — Other Ambulatory Visit: Payer: Self-pay

## 2017-07-10 ENCOUNTER — Emergency Department (HOSPITAL_COMMUNITY)
Admission: EM | Admit: 2017-07-10 | Discharge: 2017-07-10 | Disposition: A | Discharge status: Other Acute Inpatient Hospital | Payer: BLUE CROSS/BLUE SHIELD | Attending: Emergency Medicine | Admitting: Emergency Medicine

## 2017-07-10 ENCOUNTER — Encounter (HOSPITAL_COMMUNITY): Payer: Self-pay

## 2017-07-10 DIAGNOSIS — Z23 Encounter for immunization: Secondary | ICD-10-CM

## 2017-07-10 DIAGNOSIS — F332 Major depressive disorder, recurrent severe without psychotic features: Secondary | ICD-10-CM | POA: Insufficient documentation

## 2017-07-10 DIAGNOSIS — Z886 Allergy status to analgesic agent status: Secondary | ICD-10-CM | POA: Diagnosis not present

## 2017-07-10 DIAGNOSIS — T510X2A Toxic effect of ethanol, intentional self-harm, initial encounter: Secondary | ICD-10-CM | POA: Diagnosis present

## 2017-07-10 DIAGNOSIS — Z789 Other specified health status: Secondary | ICD-10-CM | POA: Insufficient documentation

## 2017-07-10 DIAGNOSIS — Z818 Family history of other mental and behavioral disorders: Secondary | ICD-10-CM | POA: Diagnosis not present

## 2017-07-10 DIAGNOSIS — F410 Panic disorder [episodic paroxysmal anxiety] without agoraphobia: Secondary | ICD-10-CM | POA: Diagnosis present

## 2017-07-10 DIAGNOSIS — F419 Anxiety disorder, unspecified: Secondary | ICD-10-CM | POA: Diagnosis not present

## 2017-07-10 DIAGNOSIS — F10239 Alcohol dependence with withdrawal, unspecified: Secondary | ICD-10-CM | POA: Diagnosis present

## 2017-07-10 DIAGNOSIS — Z9884 Bariatric surgery status: Secondary | ICD-10-CM

## 2017-07-10 DIAGNOSIS — F4001 Agoraphobia with panic disorder: Secondary | ICD-10-CM | POA: Diagnosis not present

## 2017-07-10 DIAGNOSIS — T50902A Poisoning by unspecified drugs, medicaments and biological substances, intentional self-harm, initial encounter: Secondary | ICD-10-CM | POA: Insufficient documentation

## 2017-07-10 DIAGNOSIS — G4700 Insomnia, unspecified: Secondary | ICD-10-CM | POA: Diagnosis present

## 2017-07-10 DIAGNOSIS — R45851 Suicidal ideations: Secondary | ICD-10-CM | POA: Insufficient documentation

## 2017-07-10 DIAGNOSIS — F17210 Nicotine dependence, cigarettes, uncomplicated: Secondary | ICD-10-CM | POA: Diagnosis not present

## 2017-07-10 DIAGNOSIS — F322 Major depressive disorder, single episode, severe without psychotic features: Principal | ICD-10-CM | POA: Diagnosis present

## 2017-07-10 DIAGNOSIS — J302 Other seasonal allergic rhinitis: Secondary | ICD-10-CM | POA: Diagnosis not present

## 2017-07-10 DIAGNOSIS — Z79899 Other long term (current) drug therapy: Secondary | ICD-10-CM | POA: Insufficient documentation

## 2017-07-10 DIAGNOSIS — T481X2A Poisoning by skeletal muscle relaxants [neuromuscular blocking agents], intentional self-harm, initial encounter: Secondary | ICD-10-CM | POA: Diagnosis present

## 2017-07-10 DIAGNOSIS — T1491XA Suicide attempt, initial encounter: Secondary | ICD-10-CM | POA: Diagnosis not present

## 2017-07-10 DIAGNOSIS — Z7289 Other problems related to lifestyle: Secondary | ICD-10-CM

## 2017-07-10 DIAGNOSIS — R450 Nervousness: Secondary | ICD-10-CM | POA: Diagnosis not present

## 2017-07-10 LAB — CBC
HCT: 39.2 % (ref 36.0–46.0)
Hemoglobin: 12.5 g/dL (ref 12.0–15.0)
MCH: 25.8 pg — ABNORMAL LOW (ref 26.0–34.0)
MCHC: 31.9 g/dL (ref 30.0–36.0)
MCV: 80.8 fL (ref 78.0–100.0)
PLATELETS: 362 10*3/uL (ref 150–400)
RBC: 4.85 MIL/uL (ref 3.87–5.11)
RDW: 15.4 % (ref 11.5–15.5)
WBC: 7.4 10*3/uL (ref 4.0–10.5)

## 2017-07-10 LAB — COMPREHENSIVE METABOLIC PANEL
ALK PHOS: 65 U/L (ref 38–126)
ALT: 24 U/L (ref 14–54)
ANION GAP: 14 (ref 5–15)
AST: 26 U/L (ref 15–41)
Albumin: 3.7 g/dL (ref 3.5–5.0)
BILIRUBIN TOTAL: 0.7 mg/dL (ref 0.3–1.2)
BUN: 8 mg/dL (ref 6–20)
CALCIUM: 8.6 mg/dL — AB (ref 8.9–10.3)
CO2: 18 mmol/L — ABNORMAL LOW (ref 22–32)
Chloride: 102 mmol/L (ref 101–111)
Creatinine, Ser: 0.72 mg/dL (ref 0.44–1.00)
Glucose, Bld: 105 mg/dL — ABNORMAL HIGH (ref 65–99)
POTASSIUM: 4.3 mmol/L (ref 3.5–5.1)
Sodium: 134 mmol/L — ABNORMAL LOW (ref 135–145)
TOTAL PROTEIN: 7.5 g/dL (ref 6.5–8.1)

## 2017-07-10 LAB — I-STAT BETA HCG BLOOD, ED (MC, WL, AP ONLY): I-stat hCG, quantitative: <5 m[IU]/mL (ref <5)

## 2017-07-10 LAB — ETHANOL: ALCOHOL ETHYL (B): 113 mg/dL — AB (ref <10)

## 2017-07-10 LAB — SALICYLATE LEVEL

## 2017-07-10 LAB — ACETAMINOPHEN LEVEL

## 2017-07-10 MED ORDER — LORAZEPAM 2 MG/ML IJ SOLN: 0.0000 mg | Freq: Four times a day (QID) | INTRAMUSCULAR | Status: DC

## 2017-07-10 MED ORDER — ONDANSETRON 4 MG PO TBDP: 4.0000 mg | ORAL_TABLET | Freq: Four times a day (QID) | ORAL | Status: AC | PRN

## 2017-07-10 MED ORDER — LORAZEPAM 1 MG PO TABS: 0.0000 mg | ORAL_TABLET | Freq: Two times a day (BID) | ORAL | Status: DC

## 2017-07-10 MED ORDER — VITAMIN B-1 100 MG PO TABS
100.0000 mg | ORAL_TABLET | Freq: Every day | ORAL | Status: DC
  Administered 2017-07-10: 100 mg via ORAL
  Filled 2017-07-10: qty 1

## 2017-07-10 MED ORDER — ACETAMINOPHEN 325 MG PO TABS: 650.0000 mg | ORAL_TABLET | ORAL | Status: DC | PRN

## 2017-07-10 MED ORDER — LOPERAMIDE HCL 2 MG PO CAPS: 2.0000 mg | ORAL_CAPSULE | ORAL | Status: AC | PRN

## 2017-07-10 MED ORDER — MAGNESIUM HYDROXIDE 400 MG/5ML PO SUSP: 30.0000 mL | Freq: Every day | ORAL | Status: DC | PRN

## 2017-07-10 MED ORDER — ALUM & MAG HYDROXIDE-SIMETH 200-200-20 MG/5ML PO SUSP: 30.0000 mL | ORAL | Status: DC | PRN

## 2017-07-10 MED ORDER — TETANUS-DIPHTH-ACELL PERTUSSIS 5-2.5-18.5 LF-MCG/0.5 IM SUSP
0.5000 mL | Freq: Once | INTRAMUSCULAR | Status: AC
  Administered 2017-07-10: 0.5 mL via INTRAMUSCULAR
  Filled 2017-07-10: qty 0.5

## 2017-07-10 MED ORDER — LORAZEPAM 2 MG/ML IJ SOLN: 0.0000 mg | Freq: Two times a day (BID) | INTRAMUSCULAR | Status: DC

## 2017-07-10 MED ORDER — LORAZEPAM 1 MG PO TABS: 0.0000 mg | ORAL_TABLET | Freq: Four times a day (QID) | ORAL | Status: DC

## 2017-07-10 MED ORDER — VITAMIN B-1 100 MG PO TABS: 100.0000 mg | ORAL_TABLET | Freq: Every day | ORAL | Status: DC

## 2017-07-10 MED ORDER — HALOPERIDOL 5 MG PO TABS: 5.0000 mg | ORAL_TABLET | Freq: Four times a day (QID) | ORAL | Status: DC | PRN

## 2017-07-10 MED ORDER — ONDANSETRON 4 MG PO TBDP
4.0000 mg | ORAL_TABLET | Freq: Once | ORAL | Status: AC
  Administered 2017-07-10: 4 mg via ORAL
  Filled 2017-07-10: qty 1

## 2017-07-10 MED ORDER — LORAZEPAM 1 MG PO TABS
1.0000 mg | ORAL_TABLET | Freq: Two times a day (BID) | ORAL | Status: AC
  Administered 2017-07-12 (×2): 1 mg via ORAL
  Filled 2017-07-10 (×2): qty 1

## 2017-07-10 MED ORDER — HYDROXYZINE HCL 25 MG PO TABS
25.0000 mg | ORAL_TABLET | Freq: Three times a day (TID) | ORAL | Status: DC | PRN
  Administered 2017-07-12 – 2017-07-14 (×4): 25 mg via ORAL
  Filled 2017-07-10 (×3): qty 1

## 2017-07-10 MED ORDER — NICOTINE 21 MG/24HR TD PT24
21.0000 mg | MEDICATED_PATCH | Freq: Every day | TRANSDERMAL | Status: DC
  Administered 2017-07-12 – 2017-07-14 (×3): 21 mg via TRANSDERMAL
  Filled 2017-07-10 (×7): qty 1

## 2017-07-10 MED ORDER — VITAMIN B-1 100 MG PO TABS
100.0000 mg | ORAL_TABLET | Freq: Every day | ORAL | Status: DC
  Administered 2017-07-11 – 2017-07-13 (×3): 100 mg via ORAL
  Filled 2017-07-10 (×5): qty 1

## 2017-07-10 MED ORDER — LORAZEPAM 1 MG PO TABS
1.0000 mg | ORAL_TABLET | Freq: Three times a day (TID) | ORAL | Status: AC
  Administered 2017-07-11 (×3): 1 mg via ORAL
  Filled 2017-07-10 (×3): qty 1

## 2017-07-10 MED ORDER — BENZTROPINE MESYLATE 1 MG PO TABS: 1.0000 mg | ORAL_TABLET | Freq: Four times a day (QID) | ORAL | Status: DC | PRN

## 2017-07-10 MED ORDER — ACETAMINOPHEN 325 MG PO TABS: 650.0000 mg | ORAL_TABLET | Freq: Four times a day (QID) | ORAL | Status: DC | PRN

## 2017-07-10 MED ORDER — THIAMINE HCL 100 MG/ML IJ SOLN
100.0000 mg | Freq: Every day | INTRAMUSCULAR | Status: DC
Start: 2017-07-11 — End: 2017-07-10

## 2017-07-10 MED ORDER — THIAMINE HCL 100 MG/ML IJ SOLN: 100.0000 mg | Freq: Once | INTRAMUSCULAR | Status: DC

## 2017-07-10 MED ORDER — LORAZEPAM 1 MG PO TABS
1.0000 mg | ORAL_TABLET | Freq: Every day | ORAL | Status: AC
  Administered 2017-07-13: 1 mg via ORAL
  Filled 2017-07-10: qty 1

## 2017-07-10 MED ORDER — PNEUMOCOCCAL VAC POLYVALENT 25 MCG/0.5ML IJ INJ
0.5000 mL | INJECTION | INTRAMUSCULAR | Status: AC
  Administered 2017-07-11: 0.5 mL via INTRAMUSCULAR

## 2017-07-10 MED ORDER — ADULT MULTIVITAMIN W/MINERALS CH
1.0000 | ORAL_TABLET | Freq: Every day | ORAL | Status: DC
  Administered 2017-07-10 – 2017-07-15 (×6): 1 via ORAL
  Filled 2017-07-10 (×8): qty 1

## 2017-07-10 MED ORDER — THIAMINE HCL 100 MG/ML IJ SOLN: 100.0000 mg | Freq: Every day | INTRAMUSCULAR | Status: DC

## 2017-07-10 MED ORDER — TRAZODONE HCL 50 MG PO TABS
50.0000 mg | ORAL_TABLET | Freq: Every evening | ORAL | Status: DC | PRN
  Administered 2017-07-10 – 2017-07-14 (×5): 50 mg via ORAL
  Filled 2017-07-10 (×4): qty 1

## 2017-07-10 MED ORDER — LORAZEPAM 1 MG PO TABS
1.0000 mg | ORAL_TABLET | Freq: Four times a day (QID) | ORAL | Status: AC
  Administered 2017-07-10 (×3): 1 mg via ORAL
  Filled 2017-07-10 (×3): qty 1

## 2017-07-10 MED ORDER — NICOTINE 21 MG/24HR TD PT24: 21.0000 mg | MEDICATED_PATCH | Freq: Every day | TRANSDERMAL | Status: DC

## 2017-07-10 NOTE — Progress Notes (Signed)
Holly Collier is a 32 year old female [facility] after she reports that she took overdose on some medications as well as ingesting alcohol. She reports on-going anxiety and spoke about job issues and spoke about how she drinks to help with her anxiety. She denies any SI on admission and is able to contract for safety while in the hospital. She reports never being hospitalized and reports that she is not on any medications at this time. She reports that she lives with her fiancee and reports that she plans to go back there after discharge. Larenda was oriented to the unit and safety maintained.

## 2017-07-10 NOTE — ED Notes (Signed)
Called [facility] for transport to [facility]

## 2017-07-10 NOTE — Progress Notes (Signed)
Writer introduced self to pt. Writer reviewed scheduled meds with pt and assessed for withdrawal symptoms. Pt reported withdrawal symptoms of anxiety, sweats and shakiness. Pt asked about 72 hour request for discharged. Writer explained process to pt and pt signed form. Pt then asked about having belongings brought in. Process explained to pt.

## 2017-07-10 NOTE — ED Notes (Signed)
Poison control suggested that if her EKG is normal, pt can be assessed by TTS, her window of observation is over and they will medically clear her

## 2017-07-10 NOTE — ED Provider Notes (Signed)
[facility]-EMERGENCY DEPT Provider Note   CSN: [id number] Arrival date & time: 07/10/17  5188     History   Chief Complaint Chief Complaint  Patient presents with  . Suicidal    HPI Holly Collier is a 32 y.o. female.  HPI   Patient is a 32 year old female with a history of alcohol use disorder presenting for overdose attempt, reported sensation of anxiety and "panic attacks".  Patient reports that for the past approximately 4 years, she has a history of binging alcohol multiple nights a week.  Patient reports that last night, she drank an entire 12 pack of beer, which she reports is not atypical for her binging episodes.  Patient reports that she coupled with this with taking a muscle relaxant that she cannot recall the name of.  Patient does not know how many she took, but reports that there was only a small amount in the bottle left.  Patient denies any increased somnolence secondary to this, but was immediately worried that she had taken too much.  Patient reports that at the time she took it, she had suicidal thoughts.  Patient denies HI or AVH.  Patient reports that over the past 6-8 months, she has been experiencing "anxiety attacks", where she will feel overwhelmed, palpitations, chest tightness.  Patient reports these have been occurring more frequently over the past week.  Patient reports that she does have a history of seizure secondary to abrupt cessation of alcohol, but no history of DTs.  Patient has never had an inpatient psychiatric hospitalization, and has never received treatment for alcohol use disorder.  Patient reports that she has a posterior headache, which has been occurring almost daily for the past month since she fell approximately a month ago, as well as bilateral sensation of fullness in her ears which she experiences daily, but denies chest pain present, shortness of breath, or abdominal pain.  Patient does report nausea without  vomiting.  Patient presents voluntarily at this time with her fianc.  History reviewed. No pertinent past medical history.  There are no active problems to display for this patient.   Past Surgical History:  Procedure Laterality Date  . GASTRIC BYPASS    . shoulder Right      OB History   None      Home Medications    Prior to Admission medications   Medication Sig Start Date End Date Taking? Authorizing Provider  cyclobenzaprine (FLEXERIL) 5 MG tablet Take 5 mg by mouth 3 (three) times daily as needed for muscle spasms.   Yes [provider]  ibuprofen (ADVIL,MOTRIN) 200 MG tablet Take 400-600 mg by mouth every 6 (six) hours as needed for moderate pain.   Yes [provider]  meloxicam (MOBIC) 7.5 MG tablet Take 7.5 mg by mouth daily as needed for pain.   Yes [provider]  ondansetron (ZOFRAN-ODT) 4 MG disintegrating tablet Take 4 mg by mouth every 8 (eight) hours as needed for nausea or vomiting.   Yes [provider]  LORazepam (ATIVAN) 1 MG tablet Take 1 tablet (1 mg total) by mouth every 4 (four) hours as needed for anxiety (Take 1 tablet every 3-4 hours as needed for anxiety or tremors). Patient not taking: Reported on 07/10/2017 04/09/17   Karrie Meres, PA-C    Family History History reviewed. No pertinent family history.  Social History Social History   Tobacco Use  . Smoking status: Current Some Day Smoker    Packs/day:  0.50    Types: Cigarettes  . Smokeless tobacco: Never Used  Substance Use Topics  . Alcohol use: Yes    Comment: 5-6 bottles of wine  . Drug use: No     Allergies   Naprosyn [naproxen]   Review of Systems Review of Systems  Constitutional: Negative for chills and fever.  HENT: Negative for congestion, ear pain, sinus pressure, sinus pain and sore throat.   Respiratory: Negative for cough and shortness of breath.   Cardiovascular: Positive for palpitations. Negative for chest pain.    Gastrointestinal: Positive for nausea. Negative for abdominal pain and vomiting.  Genitourinary: Negative for dysuria.  Musculoskeletal: Positive for back pain and neck pain.  Skin: Negative for rash.  Neurological: Positive for headaches. Negative for dizziness and light-headedness.  Psychiatric/Behavioral: Positive for suicidal ideas. Negative for agitation. The patient is nervous/anxious.   All other systems reviewed and are negative.    Physical Exam Updated Vital Signs BP 128/84 (BP Location: Left Arm)   Pulse (!) 108   Temp 99.7 F (37.6 C) (Oral)   Resp 18   Ht 5\' 8"  (1.727 m)   Wt (!) 140.6 kg (310 lb)   SpO2 98%   BMI 47.14 kg/m   Physical Exam  Constitutional: She appears well-developed and well-nourished. No distress.  HENT:  Head: Normocephalic and atraumatic.  Mouth/Throat: Oropharynx is clear and moist.  Eyes: Pupils are equal, round, and reactive to light. Conjunctivae and EOM are normal.  Neck: Normal range of motion. Neck supple.  Cardiovascular: Normal rate, regular rhythm, S1 normal and S2 normal.  No murmur heard. Pulmonary/Chest: Effort normal and breath sounds normal. She has no wheezes. She has no rales.  Abdominal: Soft. She exhibits no distension.  Musculoskeletal: Normal range of motion. She exhibits no edema or deformity.  Patient exhibits diffuse cervical paraspinal muscular tenderness, but no midline tenderness.  No crepitus.  No step-off.  Lymphadenopathy:    She has no cervical adenopathy.  Neurological: She is alert.  Mental Status:  Alert, oriented, thought content appropriate, able to give a coherent history. Speech fluent without evidence of aphasia. Able to follow 2 step commands without difficulty.  Cranial Nerves:  II:  Peripheral visual fields grossly normal, pupils equal, round, reactive to light III,IV, VI: ptosis not present, extra-ocular motions intact bilaterally  V,VII: smile symmetric, facial light touch sensation  equal VIII: hearing grossly normal to voice  X: uvula elevates symmetrically  XI: bilateral shoulder shrug symmetric and strong XII: midline tongue extension without fassiculations Motor:  Normal tone. 5/5 in upper and lower extremities bilaterally including strong and equal grip strength and dorsiflexion/plantar flexion Sensory: Light touch normal in all extremities. . Cerebellar: Patient exhibits a tremor at extremes of breech with finger to nose, but no evidence of ataxia or dysmetria.   Gait: normal gait and balance, with no ataxia.  Stance: Romberg negative. No pronator drift and good coordination, strength, and position sense with tapping of bilateral arms (performed in sitting position).   Skin: Skin is warm and dry. No rash noted. No erythema.  Psychiatric:  Patient oriented to person, place, and time.  Patient tearful during examination.  Patient is with normal affect.   Nursing note and vitals reviewed.    ED Treatments / Results  Labs (all labs ordered are listed, but only abnormal results are displayed) Labs Reviewed  COMPREHENSIVE METABOLIC PANEL - Abnormal; Notable for the following components:      Result Value   Sodium 134 (*)  CO2 18 (*)    Glucose, Bld 105 (*)    Calcium 8.6 (*)    All other components within normal limits  ETHANOL - Abnormal; Notable for the following components:   Alcohol, Ethyl (B) 113 (*)    All other components within normal limits  ACETAMINOPHEN LEVEL - Abnormal; Notable for the following components:   Acetaminophen (Tylenol), Serum <10 (*)    All other components within normal limits  CBC - Abnormal; Notable for the following components:   MCH 25.8 (*)    All other components within normal limits  SALICYLATE LEVEL  RAPID URINE DRUG SCREEN, HOSP PERFORMED  I-STAT BETA HCG BLOOD, ED ([facility], [facility], [facility] ONLY)    EKG EKG Interpretation  Date/Time:  Wednesday July 10 2017 07:15:53 EDT Ventricular Rate:  78 PR Interval:    QRS  Duration: 74 QT Interval:  386 QTC Calculation: 440 R Axis:   46 Text Interpretation:  Sinus rhythm No significant change since last tracing Confirmed by Richardean Canal [phone]) on 07/10/2017 8:04:34 AM   Radiology No results found.  Procedures Procedures (including critical care time)  Medications Ordered in ED Medications  Tdap (BOOSTRIX) injection 0.5 mL (has no administration in time range)  LORazepam (ATIVAN) injection 0-4 mg (has no administration in time range)    Or  LORazepam (ATIVAN) tablet 0-4 mg (has no administration in time range)  LORazepam (ATIVAN) injection 0-4 mg (has no administration in time range)    Or  LORazepam (ATIVAN) tablet 0-4 mg (has no administration in time range)  thiamine (VITAMIN B-1) tablet 100 mg (has no administration in time range)    Or  thiamine (B-1) injection 100 mg (has no administration in time range)     Initial Impression / Assessment and Plan / ED Course  I have reviewed the triage vital signs and the nursing notes.  Pertinent labs & imaging results that were available during my care of the patient were reviewed by me and considered in my medical decision making (see chart for details).  Clinical Course as of Jul 10 1036  Wed Jul 10, 2017  4782 Patient reassessed.  Patient tolerating p.o.   [AM]    Clinical Course User Index [AM] Elisha Ponder, PA-C    Patient is nontoxic-appearing, not tachycardic on my examination, and in no acute distress.  I do have concerns about patient's mental health, given that she had an overdose attempt, which she reports that this never occurred before.  Patient is exhibiting tremors on examination, but is not tachycardic or hypertensive.  Will initiate CIWA protocol while awaiting TTS consultation and psychiatry.  Lab work remarkable for ethanol level of 113 on initial evaluation, the patient clinically sober at this time and evaluation not revealing any acute medical cause for patient's suicidal  ideation at this time.  CO2 18, likely secondary to ketosis from alcohol.  Anion gap is normal. Patient is voluntary at this time, but I discussed IVC with the patient and her fianc should she attempt to leave.  Patient is in understanding and agrees with plan of care.  Per poison control, provided patient's EKG is normal, patient can be medically cleared and assessed by psychiatry.  EKG with normal QTC, no evidence of ischemia, infarction, or arrhythmia.  10:38 AM Spoke with Elijah Birk of TTS, who recommends inpatient.  And tolerated to be completed by psychiatric provider.  Final Clinical Impressions(s) / ED Diagnoses   Final diagnoses:  Suicidal ideation  Alcohol use  Intentional drug overdose, initial encounter [facility])    ED Discharge Orders    None       Delia Chimes 07/10/17 1040    Charlynne Pander, MD 07/10/17 [phone]

## 2017-07-10 NOTE — ED Notes (Signed)
Call[facility] HardestyPatrice, [facility] fund[facility]N [facility]

## 2017-07-10 NOTE — ED Notes (Signed)
Pt is dressed into scrubs and belongings are in the cabinets marked "5-8".

## 2017-07-10 NOTE — ED Notes (Signed)
Bed: WTR6 Expected date:  Expected time:  Means of arrival:  Comments: 

## 2017-07-10 NOTE — ED Notes (Signed)
[facility]
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
## 2017-07-10 NOTE — [facility] Counselor (Signed)
Disposition:   Dr. Sharma CovertNorman and Elta GuadeloupeLaurie Parks, NP, recommend inpatient treatment.   [facility]

## 2017-07-10 NOTE — ED Notes (Signed)
Pt attempted to urinate and was unable to, pt is currently on her menstrual cycle and says that she has the urgency

## 2017-07-10 NOTE — ED Notes (Signed)
Bed: WA07 Expected date:  Expected time:  Means of arrival:  Comments: 

## 2017-07-10 NOTE — ED Notes (Signed)
Family at bedside. 

## 2017-07-10 NOTE — ED Triage Notes (Signed)
Pt states that she has been binge drinking all weekend and tonight took several muscle relaxers, she doesn't know what kind, she states it was to harm herself and that's been building the last few days

## 2017-07-10 NOTE — ED Notes (Signed)
ED Provider at bedside.( PA) 

## 2017-07-10 NOTE — BH Assessment (Signed)
[facility]  Per Juanetta BeetsJacqueline Norman, DO, this pt requires psychiatric hospitalization at this time.  Malva LimesLinsey Strader, RN, [facility]; [facility] will be ready to receive pt at 13:00.  Pt has signed Voluntary Admission and Consent for Treatment, as well as Consent to Release Information to her fiance, and signed [facility].  Pt's nurse, Marchelle Folksmanda, has been notified, and agrees to send original paperwork along with pt via Juel [place]rowelham, and to call report to [phone].  Doylene Canninghomas Jennilee Demarco, [place]MA Behavioral Health Coordinator [phone]

## 2017-07-10 NOTE — Progress Notes (Signed)
[facility] Group Notes:  (Nursing/MHT/Case Management/Adjunct)  Date:  07/10/2017  Time:  1600 Type of Therapy:  Nurse Education/Recovery   Participation Level:  Active  Participation Quality:  Appropriate  Affect:  Appropriate  Cognitive:  Appropriate  Insight:  Appropriate  Engagement in Group:  Engaged  Modes of Intervention:  Discussion and Education  Summary of Progress/Problems:  Seraiah Nowack L  

## 2017-07-10 NOTE — ED Notes (Signed)
TTS at bedside. 

## 2017-07-10 NOTE — Tx Team (Signed)
Initial Treatment Plan 07/10/2017 2:51 PM Amiri Terence LuxJackson Collier [id number]    PATIENT STRESSORS: Occupational concerns Substance abuse   PATIENT STRENGTHS: Ability for insight Average or above average intelligence Capable of independent living General fund of knowledge Motivation for treatment/growth Supportive family/friends   PATIENT IDENTIFIED PROBLEMS: Depression Anxiety Alcohol abuse "Basically my anxiety" "Finding other tools other than alcohol"                     DISCHARGE CRITERIA:  Ability to meet basic life and health needs Improved stabilization in mood, thinking, and/or behavior Verbal commitment to aftercare and medication compliance Withdrawal symptoms are absent or subacute and managed without 24-hour nursing intervention  PRELIMINARY DISCHARGE PLAN: Attend aftercare/continuing care group Return to previous living arrangement  PATIENT/FAMILY INVOLVEMENT: This treatment plan has been presented to and reviewed with the patient, Holly Collier, and/or family member, .  The patient and family have been given the opportunity to ask questions and make suggestions.  Mabrey Howland, [place], [place]RN 07/10/2017, 2:51 PM

## 2017-07-11 DIAGNOSIS — F419 Anxiety disorder, unspecified: Secondary | ICD-10-CM

## 2017-07-11 DIAGNOSIS — F4001 Agoraphobia with panic disorder: Secondary | ICD-10-CM

## 2017-07-11 DIAGNOSIS — T481X2A Poisoning by skeletal muscle relaxants [neuromuscular blocking agents], intentional self-harm, initial encounter: Secondary | ICD-10-CM

## 2017-07-11 DIAGNOSIS — T1491XA Suicide attempt, initial encounter: Secondary | ICD-10-CM

## 2017-07-11 DIAGNOSIS — Z818 Family history of other mental and behavioral disorders: Secondary | ICD-10-CM

## 2017-07-11 DIAGNOSIS — F17210 Nicotine dependence, cigarettes, uncomplicated: Secondary | ICD-10-CM

## 2017-07-11 DIAGNOSIS — T510X2A Toxic effect of ethanol, intentional self-harm, initial encounter: Secondary | ICD-10-CM

## 2017-07-11 DIAGNOSIS — F410 Panic disorder [episodic paroxysmal anxiety] without agoraphobia: Secondary | ICD-10-CM

## 2017-07-11 LAB — TSH: TSH: 2.598 u[IU]/mL (ref 0.350–4.500)

## 2017-07-11 MED ORDER — ACETAMINOPHEN 325 MG PO TABS
650.0000 mg | ORAL_TABLET | Freq: Four times a day (QID) | ORAL | Status: DC | PRN
  Administered 2017-07-11 – 2017-07-12 (×5): 650 mg via ORAL
  Filled 2017-07-11 (×4): qty 2

## 2017-07-11 MED ORDER — SERTRALINE HCL 25 MG PO TABS
25.0000 mg | ORAL_TABLET | Freq: Every day | ORAL | Status: DC
  Administered 2017-07-11 – 2017-07-12 (×2): 25 mg via ORAL
  Filled 2017-07-11 (×3): qty 1

## 2017-07-11 MED ORDER — CYCLOBENZAPRINE HCL 10 MG PO TABS
5.0000 mg | ORAL_TABLET | Freq: Three times a day (TID) | ORAL | Status: DC | PRN
Start: 2017-07-11 — End: 2017-07-16

## 2017-07-11 NOTE — Progress Notes (Signed)
Pt is new to the unit late this afternoon.  She reports that she is here for detox from alcohol and for suicidal thoughts.  She reports work related stressors and having panic attacks.  She is on Ativan protocol which has been explained to her.  Pt voices understanding.  She was encouraged to make her needs known to staff.  Pt has been pleasant and appropriate.  Support and encouragement offered.  Discharge plans are in process.  Safety maintained with q15 minute checks.
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
## 2017-07-11 NOTE — Progress Notes (Signed)
Pt reports she had a good day and is feeling better.  She says the medications are helping and denies any withdrawal symptoms at this time.  She denies SI/HI/AVH.  She reports some menstrual cramping and received Tylenol for pain.  After evening group, she sat at the table in the dayroom and worked on a puzzle.  She has been appropriate and cooperative on the unit.  Support and encouragement offered.  Discharge plans are in process.  Pt plans to return home at discharge.  Safety maintained with q15 minute checks.

## 2017-07-11 NOTE — [facility] Group Notes (Signed)
LCSW Group Therapy Note 07/11/2017 2:12 PM  Type of Therapy and Topic: Group Therapy: Avoiding Self-Sabotaging and Enabling Behaviors  Participation Level: Active  Description of Group:  In this group, patients will learn how to identify obstacles, self-sabotaging and enabling behaviors, as well as: what are they, why do we do them and what needs these behaviors meet. Discuss unhealthy relationships and how to have positive healthy boundaries with those that sabotage and enable. Explore aspects of self-sabotage and enabling in yourself and how to limit these self-destructive behaviors in everyday life.  Therapeutic Goals: 1. Patient will identify one obstacle that relates to self-sabotage and enabling behaviors 2. Patient will identify one personal self-sabotaging or enabling behavior they did prior to admission 3. Patient will state a plan to change the above identified behavior 4. Patient will demonstrate ability to communicate their needs through discussion and/or role play.   Summary of Patient Progress:  Apolonio Schneidersshleigh was engaged and participated throughout the group session. Necia stated that self sabotaging behaviors for her was drinking wine to cope with her anxiety and panic attacks. Karmel reports that her self sabotaging behaviors has kept her from accomplishing her goal of "getting back to work". Apolonio Schneidersshleigh states that she plans to see a counselor at discharge so that she can express her emotions and thoughts with a professional who can help her develop better coping skills.    Therapeutic Modalities:  Cognitive Behavioral Therapy Person-Centered Therapy Motivational Interviewing   Baldo DaubJolan Emmylou Bieker LCSWA Clinical Social Worker

## 2017-07-11 NOTE — [facility] Suicide Risk Assessment (Signed)
[facility] INPATIENT:  Family/Significant Other Suicide Prevention Education  Suicide Prevention Education:  Patient Refusal for Family/Significant Other Suicide Prevention Education: The patient Holly Collier has refused to provide written consent for family/significant other to be provided Family/Significant Other Suicide Prevention Education during admission and/or prior to discharge.  Physician notified.  Metro Kungngel M Berdena Cisek 07/11/2017, 10:03 AM

## 2017-07-11 NOTE — [facility] Group Notes (Signed)
Adult Psychoeducational Group Note  Date:  07/11/2017 Time:  1:04 PM  Group Topic/Focus:  Goals Group:   The focus of this group is to help patients establish daily goals to achieve during treatment and discuss how the patient can incorporate goal setting into their daily lives to aide in recovery.  Participation Level:  Active  Participation Quality:  Appropriate  Affect:  Appropriate  Cognitive:  Alert  Insight: Appropriate  Engagement in Group:  Engaged  Modes of Intervention:  Discussion  Additional Comments:  Pt participated in orientation/goals group. Pt goal for today is today is to stay positive.   Dellia NimsJaquesha M Omkar Stratmann 07/11/2017, 1:04 PM

## 2017-07-11 NOTE — H&P (Signed)
Psychiatric Admission Assessment Adult  Patient Identification: Holly Collier MRN:  [id number] Date of Evaluation:  07/11/2017 Chief Complaint:  MDD ALCOHOL USE DISORDER; SEVERE Principal Diagnosis: <principal problem not specified> Diagnosis:   Patient Active Problem List   Diagnosis Date Noted  . MDD (major depressive disorder), single episode, severe , no psychosis ([place]) [F32.2] 07/10/2017   History of Present Illness: Patient is seen and examined.  Patient is a 32 year old female with a past psychiatric history significant for probable panic disorder, possible anxiety disorder as well as major depression who presented to the local emergency department after an intentional overdose of Flexeril and alcohol.  The patient reported in the emergency room that this was a suicide attempt, but the patient denies that currently.  She stated that she had binged on alcohol since approximately age 86.  In the emergency room she said she had started drinking after the death of her mother.  This was around age 70.  She stated that after the death of her mother she began to have anxiety and panic symptoms, and she began treating this with alcohol.  She stated that members of her family "did not take those kind of medicines".  The patient presented to the local emergency room in January with similar symptoms, and was observed to perhaps have a seizure.  She was not admitted at that time.  She was treated for alcohol withdrawal in the emergency room and then released.  She stated she had not drank any alcohol since that emergency room visit.  She states she had a difficult time at work the day prior to admission, and then went home to drink.  She admitted the palpitations, anxiety, shortness of breath, numbness in her fingers and around her mouth.  She has not had a medical workup for the palpitations and chest pain, but an EKG from the emergency room showed a normal sinus rhythm.  She admitted to depressed  mood, some degree of helplessness and hopelessness.  She stated that her fianc found her at home like this and wanted to see if she can get assistance with her psychiatric situation as well as possible alcohol withdrawal.  She was admitted to the hospital for evaluation and stabilization. Associated Signs/Symptoms: Depression Symptoms:  depressed mood, anhedonia, insomnia, psychomotor agitation, fatigue, feelings of worthlessness/guilt, difficulty concentrating, hopelessness, suicidal thoughts without plan, suicidal attempt, anxiety, panic attacks, loss of energy/fatigue, (Hypo) Manic Symptoms:  Impulsivity, Anxiety Symptoms:  Excessive Worry, Panic Symptoms, Psychotic Symptoms:  Negative PTSD Symptoms: Negative Total Time spent with patient: 1 hour  Past Psychiatric History: Patient has not seen a psychiatrist as an outpatient, not taken psychiatric medicines in the past.  She was seen in the emergency room approximately in January of this year for alcohol-related issues.  Is the patient at risk to self? Yes.    Has the patient been a risk to self in the past 6 months? No.  Has the patient been a risk to self within the distant past? No.  Is the patient a risk to others? No.  Has the patient been a risk to others in the past 6 months? No.  Has the patient been a risk to others within the distant past? No.   Prior Inpatient Therapy:   Prior Outpatient Therapy:    Alcohol Screening: 1. How often do you have a drink containing alcohol?: 4 or more times a week 2. How many drinks containing alcohol do you have on a typical day when you are  drinking?: 7, 8, or 9 3. How often do you have six or more drinks on one occasion?: Weekly AUDIT-C Score: 10 4. How often during the last year have you found that you were not able to stop drinking once you had started?: Monthly 5. How often during the last year have you failed to do what was normally expected from you becasue of drinking?:  Weekly 6. How often during the last year have you needed a first drink in the morning to get yourself going after a heavy drinking session?: Less than monthly 7. How often during the last year have you had a feeling of guilt of remorse after drinking?: Weekly 8. How often during the last year have you been unable to remember what happened the night before because you had been drinking?: Monthly 9. Have you or someone else been injured as a result of your drinking?: Yes, during the last year 10. Has a relative or friend or a doctor or another health worker been concerned about your drinking or suggested you cut down?: Yes, during the last year Alcohol Use Disorder Identification Test Final Score (AUDIT): 29 Intervention/Follow-up: Alcohol Education Substance Abuse History in the last 12 months:  Yes.   Consequences of Substance Abuse: Medical Consequences:  Emergency room visit and possible seizure Previous Psychotropic Medications: No  Psychological Evaluations: No  Past Medical History: History reviewed. No pertinent past medical history.  Past Surgical History:  Procedure Laterality Date  . GASTRIC BYPASS    . shoulder Right    Family History: History reviewed. No pertinent family history. Family Psychiatric  History: Mother reportedly had depression after an extended illness. Tobacco Screening: Have you used any form of tobacco in the last 30 days? (Cigarettes, Smokeless Tobacco, Cigars, and/or Pipes): Yes Tobacco use, Select all that apply: 5 or more cigarettes per day Are you interested in Tobacco Cessation Medications?: Yes, will notify MD for an order Counseled patient on smoking cessation including recognizing danger situations, developing coping skills and basic information about quitting provided: Refused/Declined practical counseling Social History:  Social History   Substance and Sexual Activity  Alcohol Use Yes   Comment: 5-6 bottles of wine     Social History    Substance and Sexual Activity  Drug Use No    Additional Social History: Marital status: Long term relationship Long term relationship, how long?: 6 years  What types of issues is patient dealing with in the relationship?: "My drinking but otherwise we are good" Are you sexually active?: Yes What is your sexual orientation?: Heterosexual  Does patient have children?: No                         Allergies:   Allergies  Allergen Reactions  . Naprosyn [Naproxen] Anaphylaxis   Lab Results:  Results for orders placed or performed during the hospital encounter of 07/10/17 (from the past 48 hour(s))  Comprehensive metabolic panel     Status: Abnormal   Collection Time: 07/10/17  4:40 AM  Result Value Ref Range   Sodium 134 (L) 135 - 145 mmol/L   Potassium 4.3 3.5 - 5.1 mmol/L   Chloride 102 101 - 111 mmol/L   CO2 18 (L) 22 - 32 mmol/L   Glucose, Bld 105 (H) 65 - 99 mg/dL   BUN 8 6 - 20 mg/dL   Creatinine, Ser 0.72 0.44 - 1.00 mg/dL   Calcium 8.6 (L) 8.9 - 10.3 mg/dL   Total Protein 7.5  6.5 - 8.1 g/dL   Albumin 3.7 3.5 - 5.0 g/dL   AST 26 15 - 41 U/L   ALT 24 14 - 54 U/L   Alkaline Phosphatase 65 38 - 126 U/L   Total Bilirubin 0.7 0.3 - 1.2 mg/dL   GFR calc non Af Amer >60 >60 mL/min   GFR calc Af Amer >60 >60 mL/min    Comment: (NOTE) The eGFR has been calculated using the CKD EPI equation. This calculation has not been validated in all clinical situations. eGFR's persistently <60 mL/min signify possible Chronic Kidney Disease.    Anion gap 14 5 - 15    Comment: Performed at [facility], [place] [address], [place], [place] [address]  Ethanol     Status: Abnormal   Collection Time: 07/10/17  4:40 AM  Result Value Ref Range   Alcohol, Ethyl (B) 113 (H) <10 mg/dL    Comment:        LOWEST DETECTABLE LIMIT FOR SERUM ALCOHOL IS 10 mg/dL FOR MEDICAL PURPOSES ONLY Performed at [facility] [address], [place], [place]  [address]   Salicylate level     Status: None   Collection Time: 07/10/17  4:40 AM  Result Value Ref Range   Salicylate Lvl <0.0 2.8 - 30.0 mg/dL    Comment: Performed at [facility], [place] [address], [place], [place] [address]  Acetaminophen level     Status: Abnormal   Collection Time: 07/10/17  4:40 AM  Result Value Ref Range   Acetaminophen (Tylenol), Serum <10 (L) 10 - 30 ug/mL    Comment:        THERAPEUTIC CONCENTRATIONS VARY SIGNIFICANTLY. A RANGE OF 10-30 ug/mL MAY BE AN EFFECTIVE CONCENTRATION FOR MANY PATIENTS. HOWEVER, SOME ARE BEST TREATED AT CONCENTRATIONS OUTSIDE THIS RANGE. ACETAMINOPHEN CONCENTRATIONS >150 ug/mL AT 4 HOURS AFTER INGESTION AND >50 ug/mL AT 12 HOURS AFTER INGESTION ARE OFTEN ASSOCIATED WITH TOXIC REACTIONS. Performed at [facility], [place] [address] [place], [place] [address]   cbc     Status: Abnormal   Collection Time: 07/10/17  4:40 AM  Result Value Ref Range   WBC 7.4 4.0 - 10.5 K/uL   RBC 4.85 3.87 - 5.11 MIL/uL   Hemoglobin 12.5 12.0 - 15.0 g/dL   HCT 39.2 36.0 - 46.0 %   MCV 80.8 78.0 - 100.0 fL   MCH 25.8 (L) 26.0 - 34.0 pg   MCHC 31.9 30.0 - 36.0 g/dL   RDW 15.4 11.5 - 15.5 %   Platelets 362 150 - 400 K/uL    Comment: Performed at [facility], [place] [address], [place], [place] [address]  I-Stat beta hCG blood, ED     Status: None   Collection Time: 07/10/17  4:46 AM  Result Value Ref Range   I-stat hCG, quantitative <5.0 <5 mIU/mL   Comment 3            Comment:   GEST. AGE      CONC.  (mIU/mL)   <=1 WEEK        5 - 50     2 WEEKS       50 - 500     3 WEEKS       100 - 10,000     4 WEEKS     1,000 - 30,000        FEMALE AND NON-PREGNANT FEMALE:     LESS THAN 5 mIU/mL  Blood Alcohol level:  Lab Results  Component Value Date   ETH 113 (H) 07/10/2017   ETH <10 02/33/4356    Metabolic Disorder Labs:  No results found for: HGBA1C, MPG No results found for:  PROLACTIN No results found for: CHOL, TRIG, HDL, CHOLHDL, VLDL, LDLCALC  Current Medications: Current Facility-Administered Medications  Medication Dose Route Frequency Provider Last Rate Last Dose  . acetaminophen (TYLENOL) tablet 650 mg  650 mg Oral Q6H PRN Laverle Hobby, PA-C   650 mg at 07/11/17 8616  . alum & mag hydroxide-simeth (MAALOX/MYLANTA) 200-200-20 MG/5ML suspension 30 mL  30 mL Oral Q4H PRN Ethelene Hal, NP      . haloperidol (HALDOL) tablet 5 mg  5 mg Oral Q6H PRN Ethelene Hal, NP       And  . benztropine (COGENTIN) tablet 1 mg  1 mg Oral Q6H PRN Ethelene Hal, NP      . cyclobenzaprine (FLEXERIL) tablet 5 mg  5 mg Oral TID PRN Sharma Covert, MD      . hydrOXYzine (ATARAX/VISTARIL) tablet 25 mg  25 mg Oral TID PRN Ethelene Hal, NP      . loperamide (IMODIUM) capsule 2-4 mg  2-4 mg Oral PRN Sharma Covert, MD      . LORazepam (ATIVAN) tablet 1 mg  1 mg Oral TID Sharma Covert, MD   1 mg at 07/11/17 1211   Followed by  . [START ON 07/12/2017] LORazepam (ATIVAN) tablet 1 mg  1 mg Oral BID Sharma Covert, MD       Followed by  . [START ON 07/13/2017] LORazepam (ATIVAN) tablet 1 mg  1 mg Oral Daily Sharma Covert, MD      . magnesium hydroxide (MILK OF MAGNESIA) suspension 30 mL  30 mL Oral Daily PRN Ethelene Hal, NP      . multivitamin with minerals tablet 1 tablet  1 tablet Oral Daily Sharma Covert, MD   1 tablet at 07/11/17 [phone]  . nicotine (NICODERM CQ - dosed in mg/24 hours) patch 21 mg  21 mg Transdermal Daily Ethelene Hal, NP      . ondansetron (ZOFRAN-ODT) disintegrating tablet 4 mg  4 mg Oral Q6H PRN Sharma Covert, MD      . pneumococcal 23 valent vaccine (PNU-IMMUNE) injection 0.5 mL  0.5 mL Intramuscular Tomorrow-1000 Sharma Covert, MD      . sertraline (ZOLOFT) tablet 25 mg  25 mg Oral Daily Sharma Covert, MD   25 mg at 07/11/17 1211  . thiamine (B-1) injection 100 mg  100 mg  Intramuscular Once Sharma Covert, MD      . thiamine (VITAMIN B-1) tablet 100 mg  100 mg Oral Daily Sharma Covert, MD   100 mg at 07/11/17 9021  . traZODone (DESYREL) tablet 50 mg  50 mg Oral QHS PRN Ethelene Hal, NP   50 mg at 07/10/17 2133   PTA Medications: Medications Prior to Admission  Medication Sig Dispense Refill Last Dose  . cyclobenzaprine (FLEXERIL) 5 MG tablet Take 5 mg by mouth 3 (three) times daily as needed for muscle spasms.   07/09/2017 at Unknown time  . ibuprofen (ADVIL,MOTRIN) 200 MG tablet Take 400-600 mg by mouth every 6 (six) hours as needed for moderate pain.   07/09/2017 at Unknown time  . LORazepam (ATIVAN) 1 MG tablet Take 1 tablet (1 mg total) by mouth every 4 (four) hours as  needed for anxiety (Take 1 tablet every 3-4 hours as needed for anxiety or tremors). (Patient not taking: Reported on 07/10/2017) 15 tablet 0 Completed Course at Unknown time  . meloxicam (MOBIC) 7.5 MG tablet Take 7.5 mg by mouth daily as needed for pain.   Past Week at Unknown time  . ondansetron (ZOFRAN-ODT) 4 MG disintegrating tablet Take 4 mg by mouth every 8 (eight) hours as needed for nausea or vomiting.   07/09/2017 at Unknown time    Musculoskeletal: Strength & Muscle Tone: within normal limits Gait & Station: normal Patient leans: N/A  Psychiatric Specialty Exam: Physical Exam  Nursing note reviewed. Constitutional: She is oriented to person, place, and time. She appears well-developed and well-nourished.  Respiratory: Effort normal.  Musculoskeletal: Normal range of motion.  Neurological: She is alert and oriented to person, place, and time.    ROS  Blood pressure 113/71, pulse 98, temperature 98.9 F (37.2 C), temperature source Oral, resp. rate 18, height 5' 8"  (1.727 m), weight (!) 144.7 kg (319 lb).Body mass index is 48.5 kg/m.  General Appearance: Casual  Eye Contact:  Fair  Speech:  Clear and Coherent  Volume:  Normal  Mood:  Anxious  Affect:   Appropriate  Thought Process:  Coherent  Orientation:  Full (Time, Place, and Person)  Thought Content:  Logical  Suicidal Thoughts:  No  Homicidal Thoughts:  No  Memory:  Immediate;   Fair  Judgement:  Impaired  Insight:  Lacking  Psychomotor Activity:  Restlessness  Concentration:  Concentration: Fair  Recall:  [place] of Knowledge:  Good  Language:  Good  Akathisia:  Negative  Handed:  Right  AIMS (if indicated):     Assets:  Communication Skills Desire for Improvement Financial Resources/Insurance [place] Talents/Skills Vocational/Educational  ADL's:  Intact  Cognition:  WNL  Sleep:  Number of Hours: 6    Treatment Plan Summary: Daily contact with patient to assess and evaluate symptoms and progress in treatment, Medication management and Plan Patient is seen and examined.  Patient is a 32 year old female with a probable past psychiatric history significant for panic disorder with agoraphobia, generalized anxiety, major depression as well as binge drinking disorder.  She will be admitted to the hospital and evaluated.  She will be placed on 15-minute precautions.  She reports a history of some psychomotor agitation or seizure when she was in the emergency room previously for alcohol related issues.  She will be placed on seizure precautions.  She will have alcohol withdrawal precautions in place.  She is agreed to a trial of Zoloft, and will start that at 25 mg p.o. daily, and titrate that during the course of hospitalization.  She will be integrated into the milieu of the unit.  She will attend groups and learn coping skills.  She will be seen by social work for individual counseling.  Her fianc is apparently supportive, and a nondrinker.  She will be given education about alcohol.  Observation Level/Precautions:  Detox 15 minute checks Seizure  Laboratory:  Chemistry Profile  Psychotherapy:    Medications:    Consultations:     Discharge Concerns:    Estimated LOS:  Other:     Physician Treatment Plan for Primary Diagnosis: <principal problem not specified> Long Term Goal(s): Improvement in symptoms so as ready for discharge  Short Term Goals: Ability to identify changes in lifestyle to reduce recurrence of condition will improve, Ability to verbalize feelings will improve, Ability  to disclose and discuss suicidal ideas, Ability to demonstrate self-control will improve, Ability to identify and develop effective coping behaviors will improve, Ability to maintain clinical measurements within normal limits will improve, Compliance with prescribed medications will improve and Ability to identify triggers associated with substance abuse/mental health issues will improve  Physician Treatment Plan for Secondary Diagnosis: Active Problems:   MDD (major depressive disorder), single episode, severe , no psychosis ([place])  Long Term Goal(s): Improvement in symptoms so as ready for discharge  Short Term Goals: Ability to identify changes in lifestyle to reduce recurrence of condition will improve, Ability to verbalize feelings will improve, Ability to disclose and discuss suicidal ideas, Ability to demonstrate self-control will improve, Ability to identify and develop effective coping behaviors will improve, Ability to maintain clinical measurements within normal limits will improve, Compliance with prescribed medications will improve and Ability to identify triggers associated with substance abuse/mental health issues will improve  I certify that inpatient services furnished can reasonably be expected to improve the patient's condition.    Sharma Covert, MD 4/11/201912:32 PM

## 2017-07-11 NOTE — Plan of Care (Signed)
D: Apolonio Schneidersshleigh has been pleasant and cooperative today, interacting well with peers and staff. She denied SI, HI, and AVH. She reported sleeping well and having normal energy, appetite, and concentration. On her self-inventory form, she rated her level of depression 3/10, hopelessness 3/10, and anxiety 6/10.   A: Meds given as ordered, including a pneumonia vaccine and PRN acetaminophen that relieved her menstrual cramps. Q15 safety checks maintained. Support/encouragement offered.  R: Pt remains free from harm and continues with treatment. Will continue to monitor for needs/safety.   Problem: Education: Goal: Emotional status will improve Outcome: Progressing Goal: Verbalization of understanding the information provided will improve Outcome: Progressing   Problem: Activity: Goal: Interest or engagement in activities will improve Outcome: Progressing Goal: Sleeping patterns will improve Outcome: Progressing   Problem: Coping: Goal: Ability to verbalize frustrations and anger appropriately will improve Outcome: Progressing Goal: Ability to demonstrate self-control will improve Outcome: Progressing   Problem: Health Behavior/Discharge Planning: Goal: Compliance with treatment plan for underlying cause of condition will improve Outcome: Progressing   Problem: Education: Goal: Ability to make informed decisions regarding treatment will improve Outcome: Progressing   Problem: Coping: Goal: Coping ability will improve Outcome: Progressing   Problem: Medication: Goal: Compliance with prescribed medication regimen will improve Outcome: Progressing   Problem: Self-Concept: Goal: Ability to disclose and discuss suicidal ideas will improve Outcome: Progressing   Problem: Safety: Goal: Ability to remain free from injury will improve Outcome: Progressing

## 2017-07-11 NOTE — [facility] Suicide Risk Assessment (Signed)
[facility] Admission Suicide Risk Assessment   Nursing information obtained from:    Demographic factors:    Current Mental Status:    Loss Factors:    Historical Factors:    Risk Reduction Factors:     Total Time spent with patient: 1 hour Principal Problem: <principal problem not specified> Diagnosis:   Patient Active Problem List   Diagnosis Date Noted  . MDD (major depressive disorder), single episode, severe , no psychosis ([facility]) [F32.2] 07/10/2017   Subjective Data: Patient is seen and examined.  Patient is a 32 year old female with a past psychiatric history significant for probable panic disorder, possible generalized anxiety disorder as well as major depression who presented to the emergency department after an intentional overdose of consuming an unknown amount of muscle relaxants as well as binging on alcohol.  The patient reported that this was a suicide attempt.  Patient has had a history of anxiety and what sounds like panic attacks.  She had a difficult day at work yesterday, and was having palpitations, anxiety, and was emotionally upset.  Patient stated that after her mother died several years ago she had learned to compensate by binge drinking.  She really did not start drinking until after the death of her mother.  She had presented previously earlier this year to outside hospital with alcohol related issues but was discharged home.  She stated she had been sober from alcohol until yesterday.  Patient did report a history of a seizure disorder, but was not due to alcohol related issues.  She was admitted to the hospital for evaluation and stabilization.  Continued Clinical Symptoms:  Alcohol Use Disorder Identification Test Final Score (AUDIT): 29 The "Alcohol Use Disorders Identification Test", Guidelines for Use in Primary Care, Second Edition.  World Science writer [facility]). Score between 0-7:  no or low risk or alcohol related problems. Score between 8-15:  moderate risk of  alcohol related problems. Score between 16-19:  high risk of alcohol related problems. Score 20 or above:  warrants further diagnostic evaluation for alcohol dependence and treatment.   CLINICAL FACTORS:   Severe Anxiety and/or Agitation Panic Attacks Depression:   Anhedonia Hopelessness Alcohol/Substance Abuse/Dependencies   Musculoskeletal: Strength & Muscle Tone: within normal limits and flaccid Gait & Station: normal Patient leans: N/A  Psychiatric Specialty Exam: Physical Exam  Nursing note and vitals reviewed. Constitutional: She is oriented to person, place, and time. She appears well-developed and well-nourished.  HENT:  Head: Normocephalic and atraumatic.  Respiratory: Effort normal.  Musculoskeletal: Normal range of motion.  Neurological: She is oriented to person, place, and time.    ROS  Blood pressure 112/81, pulse (!) 118, temperature 98.9 F (37.2 C), temperature source Oral, resp. rate 18, height 5\' 8"  (1.727 m), weight (!) 144.7 kg (319 lb).Body mass index is 48.5 kg/m.  General Appearance: Casual  Eye Contact:  Good  Speech:  Clear and Coherent  Volume:  Normal  Mood:  Anxious  Affect:  Congruent  Thought Process:  Coherent  Orientation:  Full (Time, Place, and Person)  Thought Content:  Logical  Suicidal Thoughts:  Yes.  without intent/plan  Homicidal Thoughts:  No  Memory:  Immediate;   Fair  Judgement:  Intact  Insight:  Fair  Psychomotor Activity:  Restlessness  Concentration:  Concentration: Fair  Recall:  Good  Fund of Knowledge:  Good  Language:  Fair  Akathisia:  No  Handed:  Right  AIMS (if indicated):     Assets:  Communication Skills  Desire for Improvement Housing Resilience Social Support Vocational/Educational  ADL's:  Intact  Cognition:  WNL  Sleep:  Number of Hours: 6      COGNITIVE FEATURES THAT CONTRIBUTE TO RISK:  None    SUICIDE RISK:   Mild:  Suicidal ideation of limited frequency, intensity, duration, and  specificity.  There are no identifiable plans, no associated intent, mild dysphoria and related symptoms, good self-control (both objective and subjective assessment), few other risk factors, and identifiable protective factors, including available and accessible social support.  PLAN OF CARE: Patient seen and examined.  Patient is a 32 year old female with a probable past psychiatric history significant for panic disorder, major depression, possible complicated bereavement, generalized anxiety disorder as well as binge drinking disorder who was admitted after an intentional overdose of muscle relaxants and alcohol.  She will be integrated into the milieu.  She will attend groups.  She will learn coping skills.  She will be seen individually with social work.  We work on Brewing technologistrelaxation techniques.  She will be started on Zoloft 25 mg p.o. daily.  She also has hydroxyzine available for anxiety.  She is on an alcohol withdrawal protocol.  She will be placed on seizure precautions given her history.  Her EKG is negative.  They have not checked her thyroid, and that will be ordered for tomorrow morning.  I certify that inpatient services furnished can reasonably be expected to improve the patient's condition.   Antonieta PertGreg Lawson Tangia Pinard, MD 07/11/2017, 9:46 AM

## 2017-07-12 MED ORDER — SERTRALINE HCL 50 MG PO TABS
50.0000 mg | ORAL_TABLET | Freq: Every day | ORAL | Status: DC
  Administered 2017-07-13 – 2017-07-15 (×3): 50 mg via ORAL
  Filled 2017-07-12 (×5): qty 1

## 2017-07-12 MED ORDER — SERTRALINE HCL 25 MG PO TABS
25.0000 mg | ORAL_TABLET | Freq: Once | ORAL | Status: AC
  Administered 2017-07-12: 25 mg via ORAL
  Filled 2017-07-12: qty 1

## 2017-07-12 NOTE — Progress Notes (Signed)
Pt attend wrap up group. Her day was a  8. Her goal not to panic, and spend out of control. She wants to physical to feel good.

## 2017-07-12 NOTE — Progress Notes (Signed)
Nursing Progress Note: 7p-7a D: Pt currently presents with a pleasant/anxious affect and behavior. Pt states "I had a good day today. My arm is in a lot of pain since I got pneumonia shot." Interacting appropriately with the milieu. Pt reports fair sleep during the previous night with current medication regimen. Pt did attend wrap-up group.  A: Pt provided with medications per providers orders. Pt's labs and vitals were monitored throughout the night. Pt supported emotionally and encouraged to express concerns and questions. Pt educated on medications.  R: Pt's safety ensured with 15 minute and environmental checks. Pt currently denies SI, HI, and AVH. Pt verbally contracts to seek staff if SI,HI, or AVH occurs and to consult with staff before acting on any harmful thoughts. Will continue to monitor.

## 2017-07-12 NOTE — Progress Notes (Addendum)
D: Pt A & O X4. Denies SI, HI, AVH and pain. Presents with flat affect and depressed mood. Endorsed anxiety related to losing her job, or "I don't know how this will affect my job when I get back to [employer], like by being in here". Rates her depression 4/10, hopelessness 4/10 and anxiety 7/10. Denies active withdrawal symptoms at this time. Reports poor sleep from last night "just pain in my arm from the shot I got, so I was up for most of the night". Appetite is good. Pt observed interacting well with staff and peers. Attended unit group is was engaged.  A: Scheduled and PRN medications given as per MD's order with verbal education and effects monitored. Emotional support and availability provided to pt. Encouraged pt to voice concerns. Writer updated pt on changes made to treatment regimen (new medications, etc). Safety checks maintained without self harm gestures or outburst.  R: Pt receptive to care. Took all medications without issues. Off unit for meals and activities, returned without issues. Tolerates all PO intake well. POC maintained for safety and mood stability.

## 2017-07-12 NOTE — Progress Notes (Signed)
Recreation Therapy Notes  Date: 4.12.19 Time: 9:30 a.m. Location: [address] Dayroom   Group Topic: Stress Management   Goal Area(s) Addresses:  Goal 1.1: To reduce stress  -Patient will feel a reduction in stress level  -Patient will understand the importance of stress management  -Patient will participate during stress management group      Intervention: Stress Management  Activity: Meditation- Patients were in a peaceful environment with soft lighting enhancing patients mood. Patients listened to a body scan meditation on the calm app to help decrease tension and stress levels   Education: Stress Management, Discharge Planning.    Education Outcome: Acknowledges edcuation/In group clarification offered/Needs additional education   Clinical Observations/Feedback:: Patient did not attend     Sheryle HailDarian Danyell Shader, Recreation Therapy Intern   Sheryle HailDarian Shaylon Aden 07/12/2017 8:37 AM

## 2017-07-12 NOTE — Progress Notes (Signed)
Pt attended morning orientation/goals group and stated that her goal today is learning how to maintain her anxiety and depression so that she can focus on her abstinence from alcohol.

## 2017-07-12 NOTE — Tx Team (Signed)
Interdisciplinary Treatment and Diagnostic Plan Update  07/12/2017 Time of Session: 9:45am Holly Collier MRN: [id number]  Principal Diagnosis: <principal problem not specified>  Secondary Diagnoses: Active Problems:   MDD (major depressive disorder), single episode, severe , no psychosis ([place])   Panic disorder with agoraphobia   Current Medications:  Current Facility-Administered Medications  Medication Dose Route Frequency Provider Last Rate Last Dose  . acetaminophen (TYLENOL) tablet 650 mg  650 mg Oral Q6H PRN Laverle Hobby, PA-C   650 mg at 07/12/17 5462  . alum & mag hydroxide-simeth (MAALOX/MYLANTA) 200-200-20 MG/5ML suspension 30 mL  30 mL Oral Q4H PRN Ethelene Hal, NP      . haloperidol (HALDOL) tablet 5 mg  5 mg Oral Q6H PRN Ethelene Hal, NP       And  . benztropine (COGENTIN) tablet 1 mg  1 mg Oral Q6H PRN Ethelene Hal, NP      . cyclobenzaprine (FLEXERIL) tablet 5 mg  5 mg Oral TID PRN Sharma Covert, MD      . hydrOXYzine (ATARAX/VISTARIL) tablet 25 mg  25 mg Oral TID PRN Ethelene Hal, NP      . loperamide (IMODIUM) capsule 2-4 mg  2-4 mg Oral PRN Sharma Covert, MD      . LORazepam (ATIVAN) tablet 1 mg  1 mg Oral BID Sharma Covert, MD   1 mg at 07/12/17 0815   Followed by  . [START ON 07/13/2017] LORazepam (ATIVAN) tablet 1 mg  1 mg Oral Daily Sharma Covert, MD      . magnesium hydroxide (MILK OF MAGNESIA) suspension 30 mL  30 mL Oral Daily PRN Ethelene Hal, NP      . multivitamin with minerals tablet 1 tablet  1 tablet Oral Daily Sharma Covert, MD   1 tablet at 07/12/17 [phone]  . nicotine (NICODERM CQ - dosed in mg/24 hours) patch 21 mg  21 mg Transdermal Daily Ethelene Hal, NP   21 mg at 07/12/17 0815  . ondansetron (ZOFRAN-ODT) disintegrating tablet 4 mg  4 mg Oral Q6H PRN Sharma Covert, MD      . sertraline (ZOLOFT) tablet 25 mg  25 mg Oral Once Sharma Covert, MD      .  Derrill Memo ON 07/13/2017] sertraline (ZOLOFT) tablet 50 mg  50 mg Oral Daily Sharma Covert, MD      . thiamine (B-1) injection 100 mg  100 mg Intramuscular Once Sharma Covert, MD      . thiamine (VITAMIN B-1) tablet 100 mg  100 mg Oral Daily Sharma Covert, MD   100 mg at 07/12/17 0093  . traZODone (DESYREL) tablet 50 mg  50 mg Oral QHS PRN Ethelene Hal, NP   50 mg at 07/11/17 2118   PTA Medications: Medications Prior to Admission  Medication Sig Dispense Refill Last Dose  . cyclobenzaprine (FLEXERIL) 5 MG tablet Take 5 mg by mouth 3 (three) times daily as needed for muscle spasms.   07/09/2017 at Unknown time  . ibuprofen (ADVIL,MOTRIN) 200 MG tablet Take 400-600 mg by mouth every 6 (six) hours as needed for moderate pain.   07/09/2017 at Unknown time  . LORazepam (ATIVAN) 1 MG tablet Take 1 tablet (1 mg total) by mouth every 4 (four) hours as needed for anxiety (Take 1 tablet every 3-4 hours as needed for anxiety or tremors). (Patient not taking: Reported on 07/10/2017) 15 tablet 0 Completed Course at  Unknown time  . meloxicam (MOBIC) 7.5 MG tablet Take 7.5 mg by mouth daily as needed for pain.   Past Week at Unknown time  . ondansetron (ZOFRAN-ODT) 4 MG disintegrating tablet Take 4 mg by mouth every 8 (eight) hours as needed for nausea or vomiting.   07/09/2017 at Unknown time    Patient Stressors: Occupational concerns Substance abuse  Patient Strengths: Ability for insight Average or above average intelligence Capable of independent living [employer] of knowledge Motivation for treatment/growth Supportive family/friends  Treatment Modalities: Medication Management, Group therapy, Case management,  1 to 1 session with clinician, Psychoeducation, Recreational therapy.   Physician Treatment Plan for Primary Diagnosis: <principal problem not specified> Long Term Goal(s): Improvement in symptoms so as ready for discharge Improvement in symptoms so as ready for discharge    Short Term Goals: Ability to identify changes in lifestyle to reduce recurrence of condition will improve Ability to verbalize feelings will improve Ability to disclose and discuss suicidal ideas Ability to demonstrate self-control will improve Ability to identify and develop effective coping behaviors will improve Ability to maintain clinical measurements within normal limits will improve Compliance with prescribed medications will improve Ability to identify triggers associated with substance abuse/mental health issues will improve Ability to identify changes in lifestyle to reduce recurrence of condition will improve Ability to verbalize feelings will improve Ability to disclose and discuss suicidal ideas Ability to demonstrate self-control will improve Ability to identify and develop effective coping behaviors will improve Ability to maintain clinical measurements within normal limits will improve Compliance with prescribed medications will improve Ability to identify triggers associated with substance abuse/mental health issues will improve  Medication Management: Evaluate patient's response, side effects, and tolerance of medication regimen.  Therapeutic Interventions: 1 to 1 sessions, Unit Group sessions and Medication administration.  Evaluation of Outcomes: Not Met  Physician Treatment Plan for Secondary Diagnosis: Active Problems:   MDD (major depressive disorder), single episode, severe , no psychosis ([place])   Panic disorder with agoraphobia  Long Term Goal(s): Improvement in symptoms so as ready for discharge Improvement in symptoms so as ready for discharge   Short Term Goals: Ability to identify changes in lifestyle to reduce recurrence of condition will improve Ability to verbalize feelings will improve Ability to disclose and discuss suicidal ideas Ability to demonstrate self-control will improve Ability to identify and develop effective coping behaviors will  improve Ability to maintain clinical measurements within normal limits will improve Compliance with prescribed medications will improve Ability to identify triggers associated with substance abuse/mental health issues will improve Ability to identify changes in lifestyle to reduce recurrence of condition will improve Ability to verbalize feelings will improve Ability to disclose and discuss suicidal ideas Ability to demonstrate self-control will improve Ability to identify and develop effective coping behaviors will improve Ability to maintain clinical measurements within normal limits will improve Compliance with prescribed medications will improve Ability to identify triggers associated with substance abuse/mental health issues will improve     Medication Management: Evaluate patient's response, side effects, and tolerance of medication regimen.  Therapeutic Interventions: 1 to 1 sessions, Unit Group sessions and Medication administration.  Evaluation of Outcomes: Not Met   RN Treatment Plan for Primary Diagnosis: <principal problem not specified> Long Term Goal(s): Knowledge of disease and therapeutic regimen to maintain health will improve  Short Term Goals: Ability to remain free from injury will improve, Ability to verbalize frustration and anger appropriately will improve, Ability to demonstrate self-control, Ability to participate in  decision making will improve, Ability to verbalize feelings will improve, Ability to disclose and discuss suicidal ideas, Ability to identify and develop effective coping behaviors will improve and Compliance with prescribed medications will improve  Medication Management: RN will administer medications as ordered by provider, will assess and evaluate patient's response and provide education to patient for prescribed medication. RN will report any adverse and/or side effects to prescribing provider.  Therapeutic Interventions: 1 on 1 counseling  sessions, Psychoeducation, Medication administration, Evaluate responses to treatment, Monitor vital signs and CBGs as ordered, Perform/monitor CIWA, COWS, AIMS and Fall Risk screenings as ordered, Perform wound care treatments as ordered.  Evaluation of Outcomes: Not Met   LCSW Treatment Plan for Primary Diagnosis: <principal problem not specified> Long Term Goal(s): Safe transition to appropriate next level of care at discharge, Engage patient in therapeutic group addressing interpersonal concerns.  Short Term Goals: Engage patient in aftercare planning with referrals and resources, Increase social support, Increase ability to appropriately verbalize feelings, Increase emotional regulation, Facilitate acceptance of mental health diagnosis and concerns, Facilitate patient progression through stages of change regarding substance use diagnoses and concerns, Identify triggers associated with mental health/substance abuse issues and Increase skills for wellness and recovery  Therapeutic Interventions: Assess for all discharge needs, 1 to 1 time with Social worker, Explore available resources and support systems, Assess for adequacy in community support network, Educate family and significant other(s) on suicide prevention, Complete Psychosocial Assessment, Interpersonal group therapy.  Evaluation of Outcomes: Not Met   Progress in Treatment: Attending groups: Yes. Participating in groups: Yes. Taking medication as prescribed: Yes. Toleration medication: Yes. Family/Significant other contact made: No, will contact:  the patient refused consent at this time for collateral contacts Patient understands diagnosis: Yes. Discussing patient identified problems/goals with staff: Yes. Medical problems stabilized or resolved: Yes. Denies suicidal/homicidal ideation: Yes. Issues/concerns per patient self-inventory: No. Other:   New problem(s) identified: None  New Short Term/Long Term Goal(s):  medication stabilization, elimination of SI thoughts, development of comprehensive mental wellness plan.   Patient Goals: "I want to find better coping mechanisms to manage my panic attacks:"  Discharge Plan or Barriers: Patient plans to discharge home with her fiance' and follow up with Dr. Sanjuana Letters at [facility] for medication management services. Patient reports she will seek counseling services on her own at discharge.   Reason for Continuation of Hospitalization: Anxiety Depression Medication stabilization  Estimated Length of Stay: Sunday, 07/14/17  Attendees: Patient: Holly Collier 07/12/2017 11:04 AM  Physician: Dr. Mallie Darting, MD 07/12/2017 11:04 AM  Nursing: Nicoletta Dress, RN 07/12/2017 11:04 AM  RN Care Manager: Rhunette Croft 07/12/2017 11:04 AM  Social Worker: Radonna Ricker, [place] 07/12/2017 11:04 AM  Recreational Therapist: X 07/12/2017 11:04 AM  Other: X 07/12/2017 11:04 AM  Other: X 07/12/2017 11:04 AM  Other:X 07/12/2017 11:04 AM    Scribe for Treatment Team: Marylee Floras, [place] 07/12/2017 11:04 AM

## 2017-07-12 NOTE — [facility] Group Notes (Signed)
[facility] Group Notes:  (Nursing/MHT/Case Management/Adjunct)  Date:  07/12/2017  Time:  4:00 pm  Type of Therapy:  Psychoeducational Skills  Participation Level:  Active  Participation Quality:  Appropriate  Affect:  Appropriate  Cognitive:  Appropriate  Insight:  Appropriate  Engagement in Group:  Engaged  Modes of Intervention:  Discussion and Education  Summary of Progress/Problems: Patient was alert and engaged in group appropriately.   Earline MayotteKnight, Charlsey Moragne [place] 07/12/2017, 6:14 PM

## 2017-07-12 NOTE — [facility] Group Notes (Signed)
LCSW Group Therapy Note 07/12/2017 2:57 PM  Type of Therapy and Topic: Group Therapy: Feelings around Relapse and Recovery  Participation Level: Active   Description of Group:  Patients in this group will discuss emotions they experience before and after a relapse. They will process how experiencing these feelings, or avoidance of experiencing them, relates to having a relapse. Facilitator will guide patients to explore emotions they have related to recovery. Patients will be encouraged to process which emotions are more powerful. They will be guided to discuss the emotional reaction significant others in their lives may have to their relapse or recovery. Patients will be assisted in exploring ways to respond to the emotions of others without this contributing to a relapse.  Therapeutic Goals: 1. Patient will identify two or more emotions that lead to a relapse for them 2. Patient will identify two emotions that result when they relapse 3. Patient will identify two emotions related to recovery 4. Patient will demonstrate ability to communicate their needs through discussion and/or role plays  Summary of Patient Progress:  Holly Collier was engaged and participated throughout the group session. Holly Collier states that relapse is "falling back into old patterns". Holly Collier reports that she has experienced relapsing on wine in the past. Holly Collier states that her main triggers are her panic attacks. Holly Collier reports that she plans to build a strong support system and that she wants to engage in counseling services once she discharges from the hospital.    Therapeutic Modalities:  Cognitive Behavioral Therapy Solution-Focused Therapy Assertiveness Training Relapse Prevention Therapy   Alcario DroughtJolan Havier Deeb LCSWA Clinical Social Worker

## 2017-07-12 NOTE — Progress Notes (Signed)
[facility] MD Progress Note  07/12/2017 10:39 AM Holly Collier  MRN:  [id number] Subjective: Patient is seen and examined.  Patient is a 32 year old female with a past psychiatric history significant for anxiety, panic disorder, depression and probable binge drinking disorder.  She is seen in follow-up.  She was seen in treatment team today with nursing and social work.  She states she is not feeling as good today.  She feels more anxious and panicky.  We discussed this.  Some of it may be related to alcohol, some of it may be the fact that she is not self-medicating her anxiety.  She stated she tolerated the Zoloft, but did have some pain in her right shoulder from a Pneumovax injection yesterday.  She denied any nausea or vomiting, just anxiety.  We discussed relaxation techniques.  We discussed breathing techniques.  We discussed increasing her medication.  She denied any suicidal ideation today, but is still more tearful as well as anxious. Principal Problem: <principal problem not specified> Diagnosis:   Patient Active Problem List   Diagnosis Date Noted  . 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 MDD (major depressive disorder), single episode, severe , no psychosis (HCC) [F32.2] 07/10/2017   Total Time spent with patient: 20 minutes  Past Psychiatric History: See admission H&P  Past Medical History: History reviewed. No pertinent past medical history.  Past Surgical History:  Procedure Laterality Date  . GASTRIC BYPASS    . shoulder Right    Family History: History reviewed. No pertinent family history. Family Psychiatric  History: See admission H&P Social History:  Social History   Substance and Sexual Activity  Alcohol Use Yes   Comment: 5-6 bottles of wine     Social History   Substance and Sexual Activity  Drug Use No    Social History   Socioeconomic History  . Marital status: Single    Spouse name: Not on file  . Number of children: Not on file  . Years of  education: Not on file  . Highest education level: Not on file  Occupational History  . Not on file  Social Needs  . Financial resource strain: Not on file  . Food insecurity:    Worry: Not on file    Inability: Not on file  . Transportation needs:    Medical: Not on file    Non-medical: Not on file  Tobacco Use  . Smoking status: Current Some Day Smoker    Packs/day: 0.50    Types: Cigarettes  . Smokeless tobacco: Never Used  Substance and Sexual Activity  . Alcohol use: Yes    Comment: 5-6 bottles of wine  . Drug use: No  . Sexual activity: Yes  Lifestyle  . Physical activity:    Days per week: Not on file    Minutes per session: Not on file  . Stress: Not on file  Relationships  . Social connections:    Talks on phone: Not on file    Gets together: Not on file    Attends religious service: Not on file    Active member of club or organization: Not on file    Attends meetings of clubs or organizations: Not on file    Relationship status: Not on file  Other Topics Concern  . Not on file  Social History Narrative  . Not on file   Additional Social History:  Sleep: Poor  Appetite:  Fair  Current Medications: Current Facility-Administered Medications  Medication Dose Route Frequency Provider Last Rate Last Dose  . acetaminophen (TYLENOL) tablet 650 mg  650 mg Oral Q6H PRN Kerry HoughSimon, Spencer E, PA-C   650 mg at 07/12/17 [id number]  . alum & mag hydroxide-simeth (MAALOX/MYLANTA) 200-200-20 MG/5ML suspension 30 mL  30 mL Oral Q4H PRN Laveda AbbeParks, Laurie Britton, NP      . haloperidol (HALDOL) tablet 5 mg  5 mg Oral Q6H PRN Laveda AbbeParks, Laurie Britton, NP       And  . benztropine (COGENTIN) tablet 1 mg  1 mg Oral Q6H PRN Laveda AbbeParks, Laurie Britton, NP      . cyclobenzaprine (FLEXERIL) tablet 5 mg  5 mg Oral TID PRN Antonieta Pertlary, Greg Lawson, MD      . hydrOXYzine (ATARAX/VISTARIL) tablet 25 mg  25 mg Oral TID PRN Laveda AbbeParks, Laurie Britton, NP      . loperamide (IMODIUM)  capsule 2-4 mg  2-4 mg Oral PRN Antonieta Pertlary, Greg Lawson, MD      . LORazepam (ATIVAN) tablet 1 mg  1 mg Oral BID Antonieta Pertlary, Greg Lawson, MD   1 mg at 07/12/17 0815   Followed by  . [START ON 07/13/2017] LORazepam (ATIVAN) tablet 1 mg  1 mg Oral Daily Antonieta Pertlary, Greg Lawson, MD      . magnesium hydroxide (MILK OF MAGNESIA) suspension 30 mL  30 mL Oral Daily PRN Laveda AbbeParks, Laurie Britton, NP      . multivitamin with minerals tablet 1 tablet  1 tablet Oral Daily Antonieta Pertlary, Greg Lawson, MD   1 tablet at 07/12/17 [phone]  . nicotine (NICODERM CQ - dosed in mg/24 hours) patch 21 mg  21 mg Transdermal Daily Laveda AbbeParks, Laurie Britton, NP   21 mg at 07/12/17 0815  . ondansetron (ZOFRAN-ODT) disintegrating tablet 4 mg  4 mg Oral Q6H PRN Antonieta Pertlary, Greg Lawson, MD      . sertraline (ZOLOFT) tablet 25 mg  25 mg Oral Once Antonieta Pertlary, Greg Lawson, MD      . Melene Muller[START ON 07/13/2017] sertraline (ZOLOFT) tablet 50 mg  50 mg Oral Daily Antonieta Pertlary, Greg Lawson, MD      . thiamine (B-1) injection 100 mg  100 mg Intramuscular Once Antonieta Pertlary, Greg Lawson, MD      . thiamine (VITAMIN B-1) tablet 100 mg  100 mg Oral Daily Antonieta Pertlary, Greg Lawson, MD   100 mg at 07/12/17 [id number]  . traZODone (DESYREL) tablet 50 mg  50 mg Oral QHS PRN Laveda AbbeParks, Laurie Britton, NP   50 mg at 07/11/17 2118    Lab Results:  Results for orders placed or performed during the hospital encounter of 07/10/17 (from the past 48 hour(s))  TSH     Status: None   Collection Time: 07/11/17  6:26 PM  Result Value Ref Range   TSH 2.598 0.350 - 4.500 uIU/mL    Comment: Performed by a 3rd Generation assay with a functional sensitivity of <=0.01 uIU/mL. Performed at [facility], [address] [place], [place] [id number]     Blood Alcohol level:  Lab Results  Component Value Date   ETH 113 (H) 07/10/2017   ETH <10 04/08/2017    Metabolic Disorder Labs: No results found for: HGBA1C, MPG No results found for: PROLACTIN No results found for: CHOL, TRIG, HDL, CHOLHDL, VLDL,  LDLCALC  Physical Findings: AIMS: Facial and Oral Movements Muscles of Facial Expression: None, normal Lips and Perioral Area: None, normal Jaw: None, normal Tongue: None,  normal,Extremity Movements Upper (arms, wrists, hands, fingers): None, normal Lower (legs, knees, ankles, toes): None, normal, Trunk Movements Neck, shoulders, hips: None, normal, Overall Severity Severity of abnormal movements (highest score from questions above): None, normal Incapacitation due to abnormal movements: None, normal Patient's awareness of abnormal movements (rate only patient's report): No Awareness, Dental Status Current problems with teeth and/or dentures?: No Does patient usually wear dentures?: No  CIWA:  CIWA-Ar Total: 1 COWS:     Musculoskeletal: Strength & Muscle Tone: within normal limits Gait & Station: normal Patient leans: N/A  Psychiatric Specialty Exam: Physical Exam  Nursing note and vitals reviewed. Constitutional: She is oriented to person, place, and time. She appears well-developed and well-nourished.  HENT:  Head: Normocephalic and atraumatic.  Respiratory: Effort normal.  Musculoskeletal: Normal range of motion.  Neurological: She is alert and oriented to person, place, and time.    ROS  Blood pressure 125/76, pulse 83, temperature 99 F (37.2 C), temperature source Oral, resp. rate 18, height 5\' 8"  (1.727 m), weight (!) 144.7 kg (319 lb).Body mass index is 48.5 kg/m.  General Appearance: Casual  Eye Contact:  Minimal  Speech:  Normal Rate  Volume:  Decreased  Mood:  Anxious and Depressed  Affect:  Congruent  Thought Process:  Coherent  Orientation:  Full (Time, Place, and Person)  Thought Content:  Logical  Suicidal Thoughts:  Yes.  without intent/plan  Homicidal Thoughts:  No  Memory:  Immediate;   Fair  Judgement:  Fair  Insight:  Fair  Psychomotor Activity:  Increased  Concentration:  Concentration: Fair  Recall:  [employer] of Knowledge:  Fair   Language:  Good  Akathisia:  No  Handed:  Right  AIMS (if indicated):     Assets:  Communication Skills Desire for Improvement Financial Resources/Insurance Housing Resilience Vocational/Educational  ADL's:  Intact  Cognition:  WNL  Sleep:  Number of Hours: 6.75     Treatment Plan Summary: Daily contact with patient to assess and evaluate symptoms and progress in treatment, Medication management and Plan Patient is seen and examined.  Patient is a 32 year old female with the above-stated past psychiatric history seen in follow-up.  She is not doing as well today.  She is having increased tearfulness as well as increased anxiety and panic symptoms.  #1 increase Zoloft to 50 mg p.o. daily, #2 continue hydroxyzine as needed for anxiety, #3 discussed deep breathing techniques for relaxation therapy, #4 no other changes in her medications.  Antonieta Pert, MD 07/12/2017, 10:39 AM

## 2017-07-13 DIAGNOSIS — G4700 Insomnia, unspecified: Secondary | ICD-10-CM

## 2017-07-13 DIAGNOSIS — R450 Nervousness: Secondary | ICD-10-CM

## 2017-07-13 DIAGNOSIS — F322 Major depressive disorder, single episode, severe without psychotic features: Principal | ICD-10-CM

## 2017-07-13 DIAGNOSIS — J302 Other seasonal allergic rhinitis: Secondary | ICD-10-CM | POA: Diagnosis present

## 2017-07-13 MED ORDER — LORATADINE 10 MG PO TABS
10.0000 mg | ORAL_TABLET | Freq: Every day | ORAL | Status: DC
  Administered 2017-07-13 – 2017-07-15 (×3): 10 mg via ORAL
  Filled 2017-07-13 (×6): qty 1

## 2017-07-13 NOTE — Progress Notes (Signed)
[facility] MD Progress Note  07/13/2017 12:27 PM Holly Collier  MRN:  [id number]   Subjective: Patient is a 32 year old female and reports that she feels better today.  She reports that her depression is mild and her anxiety comes from worrying about work.  She works for [employer] and she is concerned about the FMLA paperwork and time from work for the last week.  She is open to discharge tomorrow so that she can return to work on Monday.  She denies any SI/HI/AVH and contracts for safety.  She denies any medication side effects.  She denies any withdrawals.  Objective: Patient's chart and findings reviewed and discussed with treatment team.  Patient presents much better than was reported for yesterday.  Patient is pleasant, cooperative, and smiling.  She has significant concerns about her appointment.  She will be returning home to her fianc and we will request CSW to gain collateral from fianc.  Due to patient's complaint of allergy symptoms will start Claritin 10 mg p.o. daily and will continue all other meds as prescribed.  Principal Problem: MDD (major depressive disorder), single episode, severe , no psychosis (HCC) Diagnosis:   Patient Active Problem List   Diagnosis Date Noted  . Seasonal allergies [J30.2] 07/13/2017  . 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 MDD (major depressive disorder), single episode, severe , no psychosis (HCC) [F32.2] 07/10/2017   Total Time spent with patient: 30 minutes  Past Psychiatric History: See H&P  Past Medical History: History reviewed. No pertinent past medical history.  Past Surgical History:  Procedure Laterality Date  . GASTRIC BYPASS    . shoulder Right    Family History: History reviewed. No pertinent family history. Family Psychiatric  History: See H&P Social History:  Social History   Substance and Sexual Activity  Alcohol Use Yes   Comment: 5-6 bottles of wine     Social History   Substance and Sexual Activity  Drug  Use No    Social History   Socioeconomic History  . Marital status: Single    Spouse name: Not on file  . Number of children: Not on file  . Years of education: Not on file  . Highest education level: Not on file  Occupational History  . Not on file  Social Needs  . Financial resource strain: Not on file  . Food insecurity:    Worry: Not on file    Inability: Not on file  . Transportation needs:    Medical: Not on file    Non-medical: Not on file  Tobacco Use  . Smoking status: Current Some Day Smoker    Packs/day: 0.50    Types: Cigarettes  . Smokeless tobacco: Never Used  Substance and Sexual Activity  . Alcohol use: Yes    Comment: 5-6 bottles of wine  . Drug use: No  . Sexual activity: Yes  Lifestyle  . Physical activity:    Days per week: Not on file    Minutes per session: Not on file  . Stress: Not on file  Relationships  . Social connections:    Talks on phone: Not on file    Gets together: Not on file    Attends religious service: Not on file    Active member of club or organization: Not on file    Attends meetings of clubs or organizations: Not on file    Relationship status: Not on file  Other Topics Concern  . Not on file  Social  History Narrative  . Not on file   Additional Social History:                         Sleep: Good  Appetite:  Good  Current Medications: Current Facility-Administered Medications  Medication Dose Route Frequency Provider Last Rate Last Dose  . acetaminophen (TYLENOL) tablet 650 mg  650 mg Oral Q6H PRN Kerry Hough, PA-C   650 mg at 07/12/17 2123  . alum & mag hydroxide-simeth (MAALOX/MYLANTA) 200-200-20 MG/5ML suspension 30 mL  30 mL Oral Q4H PRN Laveda Abbe, NP      . haloperidol (HALDOL) tablet 5 mg  5 mg Oral Q6H PRN Laveda Abbe, NP       And  . benztropine (COGENTIN) tablet 1 mg  1 mg Oral Q6H PRN Laveda Abbe, NP      . cyclobenzaprine (FLEXERIL) tablet 5 mg  5 mg Oral  TID PRN Antonieta Pert, MD      . hydrOXYzine (ATARAX/VISTARIL) tablet 25 mg  25 mg Oral TID PRN Laveda Abbe, NP   25 mg at 07/12/17 2123  . loperamide (IMODIUM) capsule 2-4 mg  2-4 mg Oral PRN Antonieta Pert, MD      . loratadine (CLARITIN) tablet 10 mg  10 mg Oral Daily Imir Brumbach, Gerlene Burdock, FNP   10 mg at 07/13/17 1054  . magnesium hydroxide (MILK OF MAGNESIA) suspension 30 mL  30 mL Oral Daily PRN Laveda Abbe, NP      . multivitamin with minerals tablet 1 tablet  1 tablet Oral Daily Antonieta Pert, MD   1 tablet at 07/13/17 0816  . nicotine (NICODERM CQ - dosed in mg/24 hours) patch 21 mg  21 mg Transdermal Daily Laveda Abbe, NP   21 mg at 07/13/17 0816  . ondansetron (ZOFRAN-ODT) disintegrating tablet 4 mg  4 mg Oral Q6H PRN Antonieta Pert, MD      . sertraline (ZOLOFT) tablet 50 mg  50 mg Oral Daily Antonieta Pert, MD   50 mg at 07/13/17 0816  . thiamine (B-1) injection 100 mg  100 mg Intramuscular Once Antonieta Pert, MD      . thiamine (VITAMIN B-1) tablet 100 mg  100 mg Oral Daily Antonieta Pert, MD   100 mg at 07/13/17 0816  . traZODone (DESYREL) tablet 50 mg  50 mg Oral QHS PRN Laveda Abbe, NP   50 mg at 07/12/17 2123    Lab Results:  Results for orders placed or performed during the hospital encounter of 07/10/17 (from the past 48 hour(s))  TSH     Status: None   Collection Time: 07/11/17  6:26 PM  Result Value Ref Range   TSH 2.598 0.350 - 4.500 uIU/mL    Comment: Performed by a 3rd Generation assay with a functional sensitivity of <=0.01 uIU/mL. Performed at [facility], [address] [place], [place] [address]     Blood Alcohol level:  Lab Results  Component Value Date   ETH 113 (H) 07/10/2017   ETH <10 04/08/2017    Metabolic Disorder Labs: No results found for: HGBA1C, MPG No results found for: PROLACTIN No results found for: CHOL, TRIG, HDL, CHOLHDL, VLDL, LDLCALC  Physical  Findings: AIMS: Facial and Oral Movements Muscles of Facial Expression: None, normal Lips and Perioral Area: None, normal Jaw: None, normal Tongue: None, normal,Extremity Movements Upper (arms, wrists, hands, fingers): None,  normal Lower (legs, knees, ankles, toes): None, normal, Trunk Movements Neck, shoulders, hips: None, normal, Overall Severity Severity of abnormal movements (highest score from questions above): None, normal Incapacitation due to abnormal movements: None, normal Patient's awareness of abnormal movements (rate only patient's report): No Awareness, Dental Status Current problems with teeth and/or dentures?: No Does patient usually wear dentures?: No  CIWA:  CIWA-Ar Total: 3 COWS:     Musculoskeletal: Strength & Muscle Tone: within normal limits Gait & Station: normal Patient leans: N/A  Psychiatric Specialty Exam: Physical Exam  Nursing note and vitals reviewed. Constitutional: She is oriented to person, place, and time. She appears well-developed and well-nourished.  Cardiovascular: Normal rate.  Respiratory: Effort normal.  Musculoskeletal: Normal range of motion.  Neurological: She is alert and oriented to person, place, and time.  Skin: Skin is warm.    Review of Systems  Constitutional: Negative.   HENT: Positive for congestion.   Eyes: Negative.   Respiratory: Negative.   Cardiovascular: Negative.   Gastrointestinal: Negative.   Genitourinary: Negative.   Musculoskeletal: Negative.   Skin: Negative.   Neurological: Negative.   Endo/Heme/Allergies: Negative.   Psychiatric/Behavioral: Positive for depression (mild). Negative for hallucinations and suicidal ideas. The patient is nervous/anxious. The patient does not have insomnia.     Blood pressure (!) 125/98, pulse 74, temperature 98.8 F (37.1 C), resp. rate 18, height 5\' 8"  (1.727 m), weight (!) 144.7 kg (319 lb).Body mass index is 48.5 kg/m.  General Appearance: Casual  Eye Contact:  Good   Speech:  Clear and Coherent and Normal Rate  Volume:  Normal  Mood:  Euthymic  Affect:  Congruent  Thought Process:  Goal Directed and Descriptions of Associations: Intact  Orientation:  Full (Time, Place, and Person)  Thought Content:  WDL  Suicidal Thoughts:  No  Homicidal Thoughts:  No  Memory:  Immediate;   Good Recent;   Good Remote;   Good  Judgement:  Fair  Insight:  Good  Psychomotor Activity:  Normal  Concentration:  Concentration: Good and Attention Span: Good  Recall:  Good  Fund of Knowledge:  Good  Language:  Good  Akathisia:  No  Handed:  Right  AIMS (if indicated):     Assets:  Communication Skills Desire for Improvement Financial Resources/Insurance Housing Physical Health Social Support Transportation  ADL's:  Intact  Cognition:  WNL  Sleep:  Number of Hours: 6.5   Problems Addressed: MDD severe Seasonal allergies  Treatment Plan Summary: Daily contact with patient to assess and evaluate symptoms and progress in treatment, Medication management and Plan is to:  -Continue Vistaril 25 mg p.o. 3 times daily as needed for anxiety -Continue Zoloft 50 mg p.o. daily for mood stability -Continue trazodone 50 mg nightly as needed for insomnia -Start Claritin 10 mg p.o. daily for seasonal allergies -Encourage group therapy participation  Holly Bunnellravis B Ayasha Ellingsen, FNP 07/13/2017, 12:27 PM

## 2017-07-13 NOTE — Plan of Care (Signed)
  Problem: Activity: Goal: Interest or engagement in activities will improve Outcome: Progressing   Problem: Coping: Goal: Ability to verbalize frustrations and anger appropriately will improve Outcome: Progressing   Problem: Coping: Goal: Coping ability will improve Outcome: Progressing

## 2017-07-13 NOTE — Progress Notes (Signed)
[facility] Group Notes:  (Nursing/MHT/Case Management/Adjunct)  Date:  07/13/2017  Time: 2030  Type of Therapy:  wrap up group  Participation Level:  Active  Participation Quality:  Appropriate, Attentive, Sharing and Supportive  Affect:  Appropriate  Cognitive:  Appropriate  Insight:  Improving  Engagement in Group:  Engaged  Modes of Intervention:  Clarification, Education and Support  Summary of Progress/Problems: Pt shared that although she had a fair amount of anxiety today she did not have a full blown panic attack. Pt attributes this to working on not worrying about things that are out of her control. If patient could change anyone thing she would bring her mother back whom she lost 3 years ago. Pt is grateful for the relationship she has developed with her father since her mother's passing, "he is my rock".  Marcille BuffyMcNeil, Holly Collier S 07/13/2017, 9:55 PM

## 2017-07-13 NOTE — Progress Notes (Signed)
D. Pt presents with an anxious affect and congruent mood- pleasant and friendly during interactions Observed interacting appropriately in dayroom with peers. Per pt's self inventory, pt rates her depression, hopelessness and anxiety a 5/2/6, respectively. Pt writes that her goal today is "anxiety and controlling my breathing". Pt currently denies SI/HI and AVH A. Labs and vitals monitored. Pt compliant with medications. Pt supported emotionally and encouraged to express concerns and ask questions.   R. Pt remains safe with 15 minute checks. Will continue POC.

## 2017-07-13 NOTE — Progress Notes (Addendum)
D.  Pt pleasant on approach, complaint of anxiety but states that she is trying to use her breathing and coping skills before requesting prn.  Pt was positive for evening wrap up group and was observed engaged in appropriate interaction with peers on the unit.  Pt denies SI/HI/AVH at this time.  A.  Support and encouragement offered, praise given or using coping skills.  R. Pt remains safe on the unit, will continue to monitor.    07/14/17 0500 Pt states that she does not feel ready for discharge at this time, would like to be here until Monday at least.  Encouraged Pt to speak with doctor today.

## 2017-07-13 NOTE — [facility] Group Notes (Signed)
LCSW Group Therapy Note 07/13/2017 1:20 PM  Type of Therapy/Topic: Group Therapy: Emotion Regulation  Participation Level: Active   Description of Group:  The purpose of this group is to assist patients in learning to regulate negative emotions and experience positive emotions. Patients will be guided to discuss ways in which they have been vulnerable to their negative emotions. These vulnerabilities will be juxtaposed with experiences of positive emotions or situations, and patients will be challenged to use positive emotions to combat negative ones. Special emphasis will be placed on coping with negative emotions in conflict situations, and patients will process healthy conflict resolution skills.  Therapeutic Goals: 1. Patient will identify two positive emotions or experiences to reflect on in order to balance out negative emotions 2. Patient will label two or more emotions that they find the most difficult to experience 3. Patient will demonstrate positive conflict resolution skills through discussion and/or role plays  Summary of Patient Progress:  Apolonio Schneidersshleigh was engaged and participated throughout the group session. Jaxson reports that when she gets angry, she becomes very quiet. Abygale reports that she has realized that the primary emotion she experiences prior to becoming mad is betrayal. Apolonio Schneidersshleigh states that she wants to build more coping skills to better manage her anger.     Therapeutic Modalities:  Cognitive Behavioral Therapy Feelings Identification Dialectical Behavioral Therapy   Alcario DroughtJolan Leilah Polimeni LCSWA Clinical Social Worker

## 2017-07-13 NOTE — [facility] Group Notes (Signed)
[facility] Group Notes:  (Nursing)  Date:  07/13/2017  Time:  1:15 PM Type of Therapy:  Nurse Education  Participation Level:  Active  Participation Quality:  Appropriate and Attentive  Affect:  Appropriate  Cognitive:  Alert and Appropriate  Insight:  Appropriate and Good  Engagement in Group:  Engaged  Modes of Intervention:  Discussion, Education and Rapport Building  Summary of Progress/Problems: Patient was actively engaged in nurse led group. Group played a non competitive learning/communication board game that fosters listening skills as well as self expression.   Shela NevinValerie S Kenyada Dosch 07/13/2017, 2:51 PM

## 2017-07-14 MED ORDER — VITAMIN B-1 100 MG PO TABS
100.0000 mg | ORAL_TABLET | Freq: Every day | ORAL | Status: DC
  Administered 2017-07-15: 100 mg via ORAL
  Filled 2017-07-14 (×3): qty 1

## 2017-07-14 MED ORDER — CHLORDIAZEPOXIDE HCL 25 MG PO CAPS: 25.0000 mg | ORAL_CAPSULE | Freq: Four times a day (QID) | ORAL | Status: DC | PRN

## 2017-07-14 MED ORDER — ONDANSETRON 4 MG PO TBDP
ORAL_TABLET | ORAL | Status: AC
  Administered 2017-07-14: 05:00:00
  Filled 2017-07-14: qty 1

## 2017-07-14 MED ORDER — ONDANSETRON 4 MG PO TBDP
4.0000 mg | ORAL_TABLET | Freq: Four times a day (QID) | ORAL | Status: DC | PRN
  Administered 2017-07-14 (×3): 4 mg via ORAL
  Filled 2017-07-14 (×3): qty 1

## 2017-07-14 MED ORDER — LORAZEPAM 1 MG PO TABS
1.0000 mg | ORAL_TABLET | Freq: Once | ORAL | Status: AC
  Administered 2017-07-14: 1 mg via ORAL
  Filled 2017-07-14: qty 1

## 2017-07-14 MED ORDER — BUSPIRONE HCL 15 MG PO TABS
7.5000 mg | ORAL_TABLET | Freq: Two times a day (BID) | ORAL | Status: DC
  Administered 2017-07-14 – 2017-07-15 (×4): 7.5 mg via ORAL
  Filled 2017-07-14 (×2): qty 1
  Filled 2017-07-14: qty 2
  Filled 2017-07-14 (×5): qty 1

## 2017-07-14 MED ORDER — LOPERAMIDE HCL 2 MG PO CAPS: 2.0000 mg | ORAL_CAPSULE | ORAL | Status: DC | PRN

## 2017-07-14 NOTE — [facility] Group Notes (Signed)
[facility] Group Notes:  (Nursing/MHT/Case Management/Adjunct)  Date:  07/14/2017  Time:  1:15 pm  Type of Therapy: Psycho Ed Group   Participation Level:  Active  Participation Quality:  Appropriate  Affect:  Appropriate  Cognitive:  Appropriate  Insight:  Appropriate  Engagement in Group:  Engaged  Modes of Intervention:  Discussion  Summary of Progress/Problems:  Patient as alert and active in group.  Earline MayotteKnight, Tenley Winward Shephard 07/14/2017, 3:07 PM

## 2017-07-14 NOTE — Progress Notes (Signed)
[facility] MD Progress Note  07/14/2017 10:39 AM Holly Collier  MRN:  [id number] Subjective: Patient is seen and examined.  Patient is a 32 year old female with a past psychiatric history significant for depression and probable binge drinking disorder versus alcohol use disorder/dependence.  She seen in follow-up.  There have been anticipation today that she was going to be discharged, but this morning things are not going well.  She has increased anxiety.  This may be related to alcohol withdrawal as well.  The patient disclosed that she had been drinking heavily for 5-6 days prior to admission rather than just one day.  She is tremulous, tearful, and anxious. Principal Problem: MDD (major depressive disorder), single episode, severe , no psychosis (HCC) Diagnosis:   Patient Active Problem List   Diagnosis Date Noted  . Seasonal allergies [J30.2] 07/13/2017  . 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 MDD (major depressive disorder), single episode, severe , no psychosis (HCC) [F32.2] 07/10/2017   Total Time spent with patient: 20 minutes  Past Psychiatric History: See admission H&P  Past Medical History: History reviewed. No pertinent past medical history.  Past Surgical History:  Procedure Laterality Date  . GASTRIC BYPASS    . shoulder Right    Family History: History reviewed. No pertinent family history. Family Psychiatric  History: See admission H&P Social History:  Social History   Substance and Sexual Activity  Alcohol Use Yes   Comment: 5-6 bottles of wine     Social History   Substance and Sexual Activity  Drug Use No    Social History   Socioeconomic History  . Marital status: Single    Spouse name: Not on file  . Number of children: Not on file  . Years of education: Not on file  . Highest education level: Not on file  Occupational History  . Not on file  Social Needs  . Financial resource strain: Not on file  . Food insecurity:    Worry: Not on  file    Inability: Not on file  . Transportation needs:    Medical: Not on file    Non-medical: Not on file  Tobacco Use  . Smoking status: Current Some Day Smoker    Packs/day: 0.50    Types: Cigarettes  . Smokeless tobacco: Never Used  Substance and Sexual Activity  . Alcohol use: Yes    Comment: 5-6 bottles of wine  . Drug use: No  . Sexual activity: Yes  Lifestyle  . Physical activity:    Days per week: Not on file    Minutes per session: Not on file  . Stress: Not on file  Relationships  . Social connections:    Talks on phone: Not on file    Gets together: Not on file    Attends religious service: Not on file    Active member of club or organization: Not on file    Attends meetings of clubs or organizations: Not on file    Relationship status: Not on file  Other Topics Concern  . Not on file  Social History Narrative  . Not on file   Additional Social History:                         Sleep: Fair  Appetite:  Fair  Current Medications: Current Facility-Administered Medications  Medication Dose Route Frequency Provider Last Rate Last Dose  . acetaminophen (TYLENOL) tablet 650 mg  650 mg Oral Q6H  PRN Kerry Hough, PA-C   650 mg at 07/12/17 2123  . alum & mag hydroxide-simeth (MAALOX/MYLANTA) 200-200-20 MG/5ML suspension 30 mL  30 mL Oral Q4H PRN Laveda Abbe, NP      . haloperidol (HALDOL) tablet 5 mg  5 mg Oral Q6H PRN Laveda Abbe, NP       And  . benztropine (COGENTIN) tablet 1 mg  1 mg Oral Q6H PRN Laveda Abbe, NP      . busPIRone (BUSPAR) tablet 7.5 mg  7.5 mg Oral BID Antonieta Pert, MD   7.5 mg at 07/14/17 0829  . chlordiazePOXIDE (LIBRIUM) capsule 25 mg  25 mg Oral Q6H PRN Money, Gerlene Burdock, FNP      . cyclobenzaprine (FLEXERIL) tablet 5 mg  5 mg Oral TID PRN Antonieta Pert, MD      . hydrOXYzine (ATARAX/VISTARIL) tablet 25 mg  25 mg Oral TID PRN Laveda Abbe, NP   25 mg at 07/14/17 0454  .  loperamide (IMODIUM) capsule 2-4 mg  2-4 mg Oral PRN Money, Gerlene Burdock, FNP      . loratadine (CLARITIN) tablet 10 mg  10 mg Oral Daily Money, Gerlene Burdock, FNP   10 mg at 07/14/17 1610  . magnesium hydroxide (MILK OF MAGNESIA) suspension 30 mL  30 mL Oral Daily PRN Laveda Abbe, NP      . multivitamin with minerals tablet 1 tablet  1 tablet Oral Daily Antonieta Pert, MD   1 tablet at 07/14/17 [id number]  . nicotine (NICODERM CQ - dosed in mg/24 hours) patch 21 mg  21 mg Transdermal Daily Laveda Abbe, NP   21 mg at 07/14/17 0836  . ondansetron (ZOFRAN-ODT) disintegrating tablet 4 mg  4 mg Oral Q6H PRN Nira Conn A, NP   4 mg at 07/14/17 0734  . sertraline (ZOLOFT) tablet 50 mg  50 mg Oral Daily Antonieta Pert, MD   50 mg at 07/14/17 0829  . [START ON 07/15/2017] thiamine (VITAMIN B-1) tablet 100 mg  100 mg Oral Daily Money, Travis B, FNP      . traZODone (DESYREL) tablet 50 mg  50 mg Oral QHS PRN Laveda Abbe, NP   50 mg at 07/13/17 2200    Lab Results: No results found for this or any previous visit (from the past 48 hour(s)).  Blood Alcohol level:  Lab Results  Component Value Date   ETH 113 (H) 07/10/2017   ETH <10 04/08/2017    Metabolic Disorder Labs: No results found for: HGBA1C, MPG No results found for: PROLACTIN No results found for: CHOL, TRIG, HDL, CHOLHDL, VLDL, LDLCALC  Physical Findings: AIMS: Facial and Oral Movements Muscles of Facial Expression: None, normal Lips and Perioral Area: None, normal Jaw: None, normal Tongue: None, normal,Extremity Movements Upper (arms, wrists, hands, fingers): None, normal Lower (legs, knees, ankles, toes): None, normal, Trunk Movements Neck, shoulders, hips: None, normal, Overall Severity Severity of abnormal movements (highest score from questions above): None, normal Incapacitation due to abnormal movements: None, normal Patient's awareness of abnormal movements (rate only patient's report): No Awareness,  Dental Status Current problems with teeth and/or dentures?: No Does patient usually wear dentures?: No  CIWA:  CIWA-Ar Total: 7 COWS:     Musculoskeletal: Strength & Muscle Tone: within normal limits Gait & Station: normal Patient leans: N/A  Psychiatric Specialty Exam: Physical Exam  Nursing note and vitals reviewed. Constitutional: She is oriented to person, place, and time. She  appears well-developed and well-nourished.  HENT:  Head: Normocephalic and atraumatic.  Respiratory: Effort normal.  Musculoskeletal: Normal range of motion.  Neurological: She is alert and oriented to person, place, and time.    ROS  Blood pressure 132/83, pulse 80, temperature 99.1 F (37.3 C), temperature source Oral, resp. rate 20, height 5\' 8"  (1.727 m), weight (!) 144.7 kg (319 lb).Body mass index is 48.5 kg/m.  General Appearance: Casual  Eye Contact:  Minimal  Speech:  Pressured  Volume:  Normal  Mood:  Anxious  Affect:  Congruent  Thought Process:  Coherent  Orientation:  Full (Time, Place, and Person)  Thought Content:  Logical  Suicidal Thoughts:  Yes.  without intent/plan  Homicidal Thoughts:  No  Memory:  Immediate;   Fair  Judgement:  Impaired  Insight:  Lacking  Psychomotor Activity:  Increased  Concentration:  Concentration: Fair  Recall:  [employer]Fair  Fund of Knowledge:  Fair  Language:  Fair  Akathisia:  No  Handed:  Right  AIMS (if indicated):     Assets:  Communication Skills Desire for Improvement Financial Resources/Insurance Housing Resilience Social Support Vocational/Educational  ADL's:  Intact  Cognition:  WNL  Sleep:  Number of Hours: 5.25     Treatment Plan Summary: Daily contact with patient to assess and evaluate symptoms and progress in treatment, Medication management and Plan Patient is seen and examined.  Patient is a 32 year old female with the above-stated past psychiatric history seen in follow-up.  She is much more anxious and tremulous today.  It  may be a component of alcohol withdrawal.  We will put back on board the CIWA and alcohol withdrawal protocol.  I am also going to add BuSpar 7.5 mg p.o. twice daily.  I have given her 1 mg of Ativan right now.  She is really only been on the Zoloft for a couple of days, so increasing it 200 mg really would be indicated at this point.  We will just see how the changes in these medications doing the short run.  Hopefully things will improve.  Antonieta PertGreg Lawson Luwanna Brossman, MD 07/14/2017, 10:39 AM

## 2017-07-14 NOTE — [facility] Group Notes (Signed)
[facility] LCSW Group Therapy Note  Date/Time: 07/14/17, 0900  Type of Therapy/Topic:  Group Therapy:  Feelings about Diagnosis  Participation Level:  Active   Mood:pleasant   Description of Group:    This group will allow patients to explore their thoughts and feelings about diagnoses they have received. Patients will be guided to explore their level of understanding and acceptance of these diagnoses. Facilitator will encourage patients to process their thoughts and feelings about the reactions of others to their diagnosis, and will guide patients in identifying ways to discuss their diagnosis with significant others in their lives. This group will be process-oriented, with patients participating in exploration of their own experiences as well as giving and receiving support and challenge from other group members.   Therapeutic Goals: 1. Patient will demonstrate understanding of diagnosis as evidence by identifying two or more symptoms of the disorder:  2. Patient will be able to express two feelings regarding the diagnosis 3. Patient will demonstrate ability to communicate their needs through discussion and/or role plays  Summary of Patient Progress:Pt was active in discussion of symptoms of several diagnoses.  Pt made a number of good comments and reported at the end that she is in agreement with her current diagnosis.         Therapeutic Modalities:   Cognitive Behavioral Therapy Brief Therapy Feelings Identification   Greg Kalima Saylor, LCSW 

## 2017-07-14 NOTE — Progress Notes (Signed)
D. Pt presents with an anxious affect and congruent mood- pleasant and cooperative during interactions- observed interacting appropriately with peers in dayroom. Pt reports having slept poorly last night despite having had sleep medication and was complaining of nausea this morning- reporting that she "threw up" some of her breakfast. Pt reports having anxiety about returning home today- stating, "I'm worried I'll go right back to stress". Per pt's self inventory, pt rates her depression, hopelessness and anxiety a 6/4/8, respectively. Pt writes that her goal today is "staying positive".  Pt currently denies SI/HI and AVH.  A. Labs and vitals monitored. Pt compliant with medications. Pt supported emotionally and encouraged to express concerns and ask questions.   R. Pt's nausea relieved by prn antiemetic med. Pt remains safe with 15 minute checks. Will continue POC.

## 2017-07-15 MED ORDER — TRAZODONE HCL 50 MG PO TABS
50.0000 mg | ORAL_TABLET | Freq: Every evening | ORAL | 0 refills | Status: DC | PRN
Start: 2017-07-15 — End: 2019-09-10

## 2017-07-15 MED ORDER — CYCLOBENZAPRINE HCL 5 MG PO TABS: 5.0000 mg | ORAL_TABLET | Freq: Three times a day (TID) | ORAL | 0 refills | Status: DC | PRN

## 2017-07-15 MED ORDER — HYDROXYZINE HCL 25 MG PO TABS: 25.0000 mg | ORAL_TABLET | Freq: Three times a day (TID) | ORAL | 0 refills | Status: DC | PRN

## 2017-07-15 MED ORDER — BUSPIRONE HCL 7.5 MG PO TABS: 7.5000 mg | ORAL_TABLET | Freq: Two times a day (BID) | ORAL | 0 refills | Status: DC

## 2017-07-15 MED ORDER — SERTRALINE HCL 50 MG PO TABS: 50.0000 mg | ORAL_TABLET | Freq: Every day | ORAL | 0 refills | Status: DC

## 2017-07-15 NOTE — Progress Notes (Signed)
  [facility] :  Will you be returning to the same living situation after discharge:  Yes,  patient plans to return home with her fiance' At discharge, do you have transportation home?: Yes,  patient reports her fiance' will pick her up at discharge Do you have the ability to pay for your medications: Yes,  BCBS  Release of information consent forms completed and in the chart;  Patient's signature needed at discharge.  Patient to Follow up at: Follow-up Information    [facility]. Go on 07/18/2017.   Why:  Medication management appointment 07/18/17 at 5:00pm Contact information: [address] [place], [place] [address] Phone: [phone] Fax: [phone]          Next level of care provider [facility]:yes  Safety Planning and Suicide Prevention discussed: Yes,  with the patient  Have you used any form of tobacco in the last 30 days? (Cigarettes, Smokeless Tobacco, Cigars, and/or Pipes): Yes  Has patient been referred to the Quitline?: Patient refused referral  Patient has been referred for addiction treatment: Pt. refused referral  Maeola SarahJolan E Altheria Shadoan, LCSWA 07/15/2017, 12:20 PM

## 2017-07-15 NOTE — Discharge Summary (Signed)
Physician Discharge Summary Note  Patient:  Holly Collier is an 32 y.o., female MRN:  [id number] DOB:  June 08, 1985 Patient phone:  [phone])  Patient address:   [address] [place] [address],  Total Time spent with patient: 20 minutes  Date of Admission:  07/10/2017 Date of Discharge: 07/15/17  Reason for Admission:  Worsening depression with SI  Principal Problem: MDD (major depressive disorder), single episode, severe , no psychosis [facility](HCC) Discharge Diagnoses: Patient Active Problem List   Diagnosis Date Noted  . Seasonal allergies [J30.2] 07/13/2017  . 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 MDD (major depressive disorder), single episode, severe , no psychosis (HCC) [F32.2] 07/10/2017    Past Psychiatric History: Patient has not seen a psychiatrist as an outpatient, not taken psychiatric medicines in the past.  She was seen in the emergency room approximately in January of this year for alcohol-related issues  Past Medical History: History reviewed. No pertinent past medical history.  Past Surgical History:  Procedure Laterality Date  . GASTRIC BYPASS    . shoulder Right    Family History: History reviewed. No pertinent family history. Family Psychiatric  History: Mother reportedly had depression after an extended illness.  Social History:  Social History   Substance and Sexual Activity  Alcohol Use Yes   Comment: 5-6 bottles of wine     Social History   Substance and Sexual Activity  Drug Use No    Social History   Socioeconomic History  . Marital status: Single    Spouse name: Not on file  . Number of children: Not on file  . Years of education: Not on file  . Highest education level: Not on file  Occupational History  . Not on file  Social Needs  . Financial resource strain: Not on file  . Food insecurity:    Worry: Not on file    Inability: Not on file  . Transportation needs:    Medical: Not on file     Non-medical: Not on file  Tobacco Use  . Smoking status: Current Some Day Smoker    Packs/day: 0.50    Types: Cigarettes  . Smokeless tobacco: Never Used  Substance and Sexual Activity  . Alcohol use: Yes    Comment: 5-6 bottles of wine  . Drug use: No  . Sexual activity: Yes  Lifestyle  . Physical activity:    Days per week: Not on file    Minutes per session: Not on file  . Stress: Not on file  Relationships  . Social connections:    Talks on phone: Not on file    Gets together: Not on file    Attends religious service: Not on file    Active member of club or organization: Not on file    Attends meetings of clubs or organizations: Not on file    Relationship status: Not on file  Other Topics Concern  . Not on file  Social History Narrative  . Not on file    Hospital Course:   [facility] Assessment: Patient is seen and examined.  Patient is a 32 year old female with a past psychiatric history significant for probable panic disorder, possible anxiety disorder as well as major depression who presented to the local emergency department after an intentional overdose of Flexeril and alcohol.  The patient reported in the emergency room that this was a suicide attempt, but the patient denies that currently.  She stated that she had binged on  alcohol since approximately age 47.  In the emergency room she said she had started drinking after the death of her mother.  This was around age 64.  She stated that after the death of her mother she began to have anxiety and panic symptoms, and she began treating this with alcohol.  She stated that members of her family "did not take those kind of medicines".  The patient presented to the local emergency room in January with similar symptoms, and was observed to perhaps have a seizure.  She was not admitted at that time.  She was treated for alcohol withdrawal in the emergency room and then released.  She stated she had not drank any alcohol since  that emergency room visit.  She states she had a difficult time at work the day prior to admission, and then went home to drink.  She admitted the palpitations, anxiety, shortness of breath, numbness in her fingers and around her mouth.  She has not had a medical workup for the palpitations and chest pain, but an EKG from the emergency room showed a normal sinus rhythm.  She admitted to depressed mood, some degree of helplessness and hopelessness.  She stated that her fianc found her at home like this and wanted to see if she can get assistance with her psychiatric situation as well as possible alcohol withdrawal.  She was admitted to the hospital for evaluation and stabilization.  Patient remained on the [facility] unit for 4 days. The patient stabilized on medication and therapy. Patient was discharged on Buspar 7.5 mg BID, Vistaril 25 mg Q6H PRN, Zoloft 50 mg Daily, and Trazodone 50 mg QHS PRN. She also completed Librium Detox. Patient has shown improvement with improved mood, affect, sleep, appetite, and interaction. Patient has attended group and participated. Patient has been seen in the day room interacting with peers and staff appropriately. Patient denies any SI/HI/AVH and contracts for safety. Patient agrees to follow up at [facility]. Patient is provided with prescriptions for their medications upon discharge.    Physical Findings: AIMS: Facial and Oral Movements Muscles of Facial Expression: None, normal Lips and Perioral Area: None, normal Jaw: None, normal Tongue: None, normal,Extremity Movements Upper (arms, wrists, hands, fingers): None, normal Lower (legs, knees, ankles, toes): None, normal, Trunk Movements Neck, shoulders, hips: None, normal, Overall Severity Severity of abnormal movements (highest score from questions above): None, normal Incapacitation due to abnormal movements: None, normal Patient's awareness of abnormal movements (rate only patient's report): No Awareness, Dental  Status Current problems with teeth and/or dentures?: No Does patient usually wear dentures?: No  CIWA:  CIWA-Ar Total: 3 COWS:     Musculoskeletal: Strength & Muscle Tone: within normal limits Gait & Station: normal Patient leans: N/A  Psychiatric Specialty Exam: Physical Exam  Nursing note and vitals reviewed. Constitutional: She is oriented to person, place, and time. She appears well-developed and well-nourished.  Respiratory: Effort normal.  Musculoskeletal: Normal range of motion.  Neurological: She is alert and oriented to person, place, and time.  Skin: Skin is warm.    Review of Systems  Constitutional: Negative.   HENT: Negative.   Eyes: Negative.   Respiratory: Negative.   Cardiovascular: Negative.   Gastrointestinal: Negative.   Genitourinary: Negative.   Musculoskeletal: Negative.   Skin: Negative.   Neurological: Negative.   Endo/Heme/Allergies: Negative.   Psychiatric/Behavioral: Negative.     Blood pressure (!) 135/96, pulse (!) 102, temperature 98.9 F (37.2 C), temperature source Oral, resp. rate 18, height  5\' 8"  (1.727 m), weight (!) 144.7 kg (319 lb).Body mass index is 48.5 kg/m.  General Appearance: Casual  Eye Contact:  Good  Speech:  Clear and Coherent and Normal Rate  Volume:  Normal  Mood:  Euthymic  Affect:  Appropriate  Thought Process:  Goal Directed and Descriptions of Associations: Intact  Orientation:  Full (Time, Place, and Person)  Thought Content:  WDL  Suicidal Thoughts:  No  Homicidal Thoughts:  No  Memory:  Immediate;   Good Recent;   Good Remote;   Good  Judgement:  Fair  Insight:  Fair  Psychomotor Activity:  Normal  Concentration:  Concentration: Good and Attention Span: Good  Recall:  Good  Fund of Knowledge:  Good  Language:  Good  Akathisia:  No  Handed:  Right  AIMS (if indicated):     Assets:  Communication Skills Desire for Improvement Financial Resources/Insurance Housing Physical Health Social  Support Transportation  ADL's:  Intact  Cognition:  WNL  Sleep:  Number of Hours: 6.25     Have you used any form of tobacco in the last 30 days? (Cigarettes, Smokeless Tobacco, Cigars, and/or Pipes): Yes  Has this patient used any form of tobacco in the last 30 days? (Cigarettes, Smokeless Tobacco, Cigars, and/or Pipes) Yes, Yes, A prescription for an FDA-approved tobacco cessation medication was offered at discharge and the patient refused  Blood Alcohol level:  Lab Results  Component Value Date   ETH 113 (H) 07/10/2017   ETH <10 04/08/2017    Metabolic Disorder Labs:  No results found for: HGBA1C, MPG No results found for: PROLACTIN No results found for: CHOL, TRIG, HDL, CHOLHDL, VLDL, LDLCALC  See Psychiatric Specialty Exam and Suicide Risk Assessment completed by Attending Physician prior to discharge.  Discharge destination:  Home  Is patient on multiple antipsychotic therapies at discharge:  No   Has Patient had three or more failed trials of antipsychotic monotherapy by history:  No  Recommended Plan for Multiple Antipsychotic Therapies: NA   Allergies as of 07/15/2017      Reactions   Naprosyn [naproxen] Anaphylaxis      Medication List    STOP taking these medications   ibuprofen 200 MG tablet Commonly known as:  ADVIL,MOTRIN   LORazepam 1 MG tablet Commonly known as:  ATIVAN   meloxicam 7.5 MG tablet Commonly known as:  MOBIC   ondansetron 4 MG disintegrating tablet Commonly known as:  ZOFRAN-ODT     TAKE these medications     Indication  busPIRone 7.5 MG tablet Commonly known as:  BUSPAR Take 1 tablet (7.5 mg total) by mouth 2 (two) times daily. For anxiety  Indication:  Anxiety Disorder   cyclobenzaprine 5 MG tablet Commonly known as:  FLEXERIL Take 1 tablet (5 mg total) by mouth 3 (three) times daily as needed for muscle spasms.  Indication:  Muscle Spasm   hydrOXYzine 25 MG tablet Commonly known as:  ATARAX/VISTARIL Take 1 tablet (25  mg total) by mouth 3 (three) times daily as needed for anxiety.  Indication:  Feeling Anxious   sertraline 50 MG tablet Commonly known as:  ZOLOFT Take 1 tablet (50 mg total) by mouth daily. For mood control Start taking on:  07/16/2017  Indication:  mood stability   traZODone 50 MG tablet Commonly known as:  DESYREL Take 1 tablet (50 mg total) by mouth at bedtime as needed for sleep.  Indication:  Trouble Sleeping      Follow-up Information  [facility]. Go on 07/18/2017.   Why:  Medication management appointment 07/18/17 at 5:00pm Contact information: [address] [place], [place] [address] Phone: [phone] Fax: [phone]          Follow-up recommendations:  Continue activity as tolerated. Continue diet as recommended by your PCP. Ensure to keep all appointments with outpatient providers.  Comments:  Patient is instructed prior to discharge to: Take all medications as prescribed by his/her mental healthcare provider. Report any adverse effects and or reactions from the medicines to his/her outpatient provider promptly. Patient has been instructed & cautioned: To not engage in alcohol and or illegal drug use while on prescription medicines. In the event of worsening symptoms, patient is instructed to call the crisis hotline, 911 and or go to the nearest ED for appropriate evaluation and treatment of symptoms. To follow-up with his/her primary care provider for your other medical issues, concerns and or health care needs.    Signed: Gerlene Burdock Taydem Cavagnaro, FNP 07/15/2017, 11:40 AM

## 2017-07-15 NOTE — [facility] Suicide Risk Assessment (Signed)
[facility] INPATIENT:  Family/Significant Other Suicide Prevention Education  Suicide Prevention Education:   SPE completed with patient, as patient refused to consent to family contact. SPI pamphlet provided to pt and pt was encouraged to share information with support network, ask questions, and talk about any concerns relating to SPE. Patient denies access to guns/firearms and verbalized understanding of information provided. Mobile Crisis information also provided to patient.   

## 2017-07-15 NOTE — [facility] Suicide Risk Assessment (Signed)
[facility]   Principal Problem: MDD (major depressive disorder), single episode, severe , no psychosis (HCC) Discharge Diagnoses:  Patient Active Problem List   Diagnosis Date Noted  . Seasonal allergies [J30.2] 07/13/2017  . 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 MDD (major depressive disorder), single episode, severe , no psychosis (HCC) [F32.2] 07/10/2017    Total Time spent with patient: 30 minutes  Musculoskeletal: Strength & Muscle Tone: within normal limits Gait & Station: normal Patient leans: N/A  Psychiatric Specialty Exam: Review of Systems  All other systems reviewed and are negative.   Blood pressure (!) 135/96, pulse (!) 102, temperature 98.9 F (37.2 C), temperature source Oral, resp. rate 18, height 5\' 8"  (1.727 m), weight (!) 144.7 kg (319 lb).Body mass index is 48.5 kg/m.  General Appearance: Casual  Eye Contact::  Good  Speech:  Normal Rate409  Volume:  Normal  Mood:  Euthymic  Affect:  Congruent  Thought Process:  Coherent  Orientation:  Full (Time, Place, and Person)  Thought Content:  Logical  Suicidal Thoughts:  No  Homicidal Thoughts:  No  Memory:  Immediate;   Fair  Judgement:  Fair  Insight:  Fair  Psychomotor Activity:  Normal  Concentration:  Good  Recall:  Good  Fund of Knowledge:Good  Language: Good  Akathisia:  No  Handed:  Right  AIMS (if indicated):     Assets:  Communication Skills Desire for Improvement Financial Resources/Insurance Housing Intimacy Leisure Time Physical Health Resilience Social Support Talents/Skills Transportation Vocational/Educational  Sleep:  Number of Hours: 6.25  Cognition: WNL  ADL's:  Intact   Mental Status Per Nursing Assessment::   On Admission:     Demographic Factors:  Divorced or widowed  Loss Factors: NA  Historical Factors: Family history of mental illness or substance abuse and Impulsivity  Risk Reduction Factors:   Sense of responsibility  to family, Employed, Living with another person, especially a relative and Positive social support  Continued Clinical Symptoms:  Alcohol/Substance Abuse/Dependencies  Cognitive Features That Contribute To Risk:  None    Suicide Risk:  Minimal: No identifiable suicidal ideation.  Patients presenting with no risk factors but with morbid ruminations; may be classified as minimal risk based on the severity of the depressive symptoms  Follow-up Information    [facility]. Go on 07/18/2017.   Why:  Medication management appointment 07/18/17 at 5:00pm Contact information: [address] [place], [place] [address] Phone: [phone] Fax: [phone]          Plan Of Care/Follow-up recommendations:  Activity:  ad lib  Antonieta PertGreg Lawson Mahkayla Preece, MD 07/15/2017, 11:08 AM

## 2017-07-15 NOTE — Progress Notes (Signed)
D   Pt complained of nausea due to her nerves    She appears depressed and sad   She reports she hopes to be discharged tomorrow and was supposed to go today but was too nervous to go and was throwing up as well  A   Verbal support given    Medications administered and effectiveness monitored    Q 15 min checks R   Pt is safe at present time

## 2017-07-15 NOTE — Progress Notes (Signed)
Recreation Therapy Notes  Date: 4.15.19 Time: 9:30 a.m. Location: [address] Dayroom   Group Topic: Stress Management   Goal Area(s) Addresses:  Goal 1.1: To reduce stress  -Patient will feel a reduction in stress level  -Patient will understand the importance of stress management  -Patient will participate during stress management group   Behavioral Response: Engaged   Intervention: Stress Management   Activity: Guided Imagery: Patients were in a peaceful environment with soft lighting enhancing patients mood. Patients listened to a guided imagery script read by Recreation Therapy Intern.  Education: Stress Management, Discharge Planning.    Education Outcome: Acknowledges edcuation/In group clarification offered/Needs additional education   Clinical Observations/Feedback:: Patient attended and participated appropriately during stress management group treatment.    Holly Collier, Recreation Therapy Intern    Holly Collier 07/15/2017 9:03 AM

## 2017-07-15 NOTE — Progress Notes (Signed)
Pt d/c from the hospital with her fiance. All items returned. D/C instructions given, and prescriptions given. Pt denies si and hi.

## 2019-03-18 ENCOUNTER — Encounter (HOSPITAL_COMMUNITY): Payer: Self-pay | Admitting: Emergency Medicine

## 2019-03-18 ENCOUNTER — Other Ambulatory Visit: Payer: Self-pay

## 2019-03-18 ENCOUNTER — Emergency Department (HOSPITAL_COMMUNITY)
Admission: EM | Admit: 2019-03-18 | Discharge: 2019-03-18 | Disposition: A | Discharge status: Home / Self Care | Payer: BLUE CROSS/BLUE SHIELD | Attending: Emergency Medicine | Admitting: Emergency Medicine

## 2019-03-18 DIAGNOSIS — Z79899 Other long term (current) drug therapy: Secondary | ICD-10-CM | POA: Diagnosis not present

## 2019-03-18 DIAGNOSIS — N939 Abnormal uterine and vaginal bleeding, unspecified: Secondary | ICD-10-CM | POA: Diagnosis present

## 2019-03-18 DIAGNOSIS — F17210 Nicotine dependence, cigarettes, uncomplicated: Secondary | ICD-10-CM | POA: Diagnosis not present

## 2019-03-18 DIAGNOSIS — N921 Excessive and frequent menstruation with irregular cycle: Secondary | ICD-10-CM | POA: Diagnosis not present

## 2019-03-18 DIAGNOSIS — R1030 Lower abdominal pain, unspecified: Secondary | ICD-10-CM | POA: Diagnosis not present

## 2019-03-18 LAB — URINALYSIS, ROUTINE W REFLEX MICROSCOPIC
Bilirubin Urine: NEGATIVE
Glucose, UA: NEGATIVE mg/dL
Ketones, ur: 5 mg/dL — AB
Nitrite: NEGATIVE
Protein, ur: 100 mg/dL — AB
RBC / HPF: >50 RBC/hpf — ABNORMAL HIGH (ref 0–5)
Specific Gravity, Urine: 1.026 (ref 1.005–1.030)
pH: 5 (ref 5.0–8.0)

## 2019-03-18 LAB — I-STAT CHEM 8, ED
BUN: 11 mg/dL (ref 6–20)
Calcium, Ion: 1.13 mmol/L — ABNORMAL LOW (ref 1.15–1.40)
Chloride: 107 mmol/L (ref 98–111)
Creatinine, Ser: 0.7 mg/dL (ref 0.44–1.00)
Glucose, Bld: 95 mg/dL (ref 70–99)
HCT: 37 % (ref 36.0–46.0)
Hemoglobin: 12.6 g/dL (ref 12.0–15.0)
Potassium: 3.8 mmol/L (ref 3.5–5.1)
Sodium: 138 mmol/L (ref 135–145)
TCO2: 21 mmol/L — ABNORMAL LOW (ref 22–32)

## 2019-03-18 LAB — I-STAT BETA HCG BLOOD, ED (MC, WL, AP ONLY): I-stat hCG, quantitative: <5 m[IU]/mL (ref <5)

## 2019-03-18 MED ORDER — IBUPROFEN 600 MG PO TABS: 600.0000 mg | ORAL_TABLET | Freq: Four times a day (QID) | ORAL | 0 refills | Status: DC | PRN

## 2019-03-18 MED ORDER — KETOROLAC TROMETHAMINE 30 MG/ML IJ SOLN
30.0000 mg | Freq: Once | INTRAMUSCULAR | Status: AC
  Administered 2019-03-18: 04:00:00 30 mg via INTRAMUSCULAR
  Filled 2019-03-18: qty 1

## 2019-03-18 MED ORDER — NITROFURANTOIN MONOHYD MACRO 100 MG PO CAPS
100.0000 mg | ORAL_CAPSULE | Freq: Two times a day (BID) | ORAL | 0 refills | Status: DC
Start: 2019-03-18 — End: 2019-09-10

## 2019-03-18 NOTE — ED Provider Notes (Signed)
[facility]-EMERGENCY DEPT Provider Note   CSN: [id number] Arrival date & time: 03/18/19  0016     History Chief Complaint  Patient presents with  . Vaginal Bleeding  . Back Pain    Holly Collier is a 33 y.o. female.  HPI     This is a 33 year old female who presents with vaginal bleeding and cramping.  Patient reports that she is approximately 18 days late on her menstrual period.  She states that normally she has a regular cycle.  She had a negative pregnancy test at home.  She started bleeding today and thought she had started her period.  She states that she had intense suprapubic cramping and back discomfort.  She took some ibuprofen with minimal relief.  Rates her discomfort at 6 out of 10.  She states that at one point she could feel blood trickling down her legs.  She has used 2 pads in the last 4 hours.  Denies any lateralizing symptoms.  Denies any dizziness.  Denies any dysuria or hematuria.  History reviewed. No pertinent past medical history.  Patient Active Problem List   Diagnosis Date Noted  . Seasonal allergies 07/13/2017  . Panic disorder with agoraphobia   . MDD (major depressive disorder), single episode, severe , no psychosis (HCC) 07/10/2017    Past Surgical History:  Procedure Laterality Date  . GASTRIC BYPASS    . shoulder Right      OB History   No obstetric history on file.     No family history on file.  Social History   Tobacco Use  . Smoking status: Current Some Day Smoker    Packs/day: 0.50    Types: Cigarettes  . Smokeless tobacco: Never Used  Substance Use Topics  . Alcohol use: Yes    Comment: 5-6 bottles of wine  . Drug use: No    Home Medications Prior to Admission medications   Medication Sig Start Date End Date Taking? Authorizing Provider  busPIRone (BUSPAR) 7.5 MG tablet Take 1 tablet (7.5 mg total) by mouth 2 (two) times daily. For anxiety 07/15/17   Money, Gerlene Burdockravis B, FNP    cyclobenzaprine (FLEXERIL) 5 MG tablet Take 1 tablet (5 mg total) by mouth 3 (three) times daily as needed for muscle spasms. 07/15/17   Money, Gerlene Burdockravis B, FNP  hydrOXYzine (ATARAX/VISTARIL) 25 MG tablet Take 1 tablet (25 mg total) by mouth 3 (three) times daily as needed for anxiety. 07/15/17   Money, Gerlene Burdockravis B, FNP  ibuprofen (ADVIL) 600 MG tablet Take 1 tablet (600 mg total) by mouth every 6 (six) hours as needed. 03/18/19   Makisha Marrin, Mayer Maskerourtney F, MD  sertraline (ZOLOFT) 50 MG tablet Take 1 tablet (50 mg total) by mouth daily. For mood control 07/16/17   Money, Gerlene Burdockravis B, FNP  traZODone (DESYREL) 50 MG tablet Take 1 tablet (50 mg total) by mouth at bedtime as needed for sleep. 07/15/17   Money, Gerlene Burdockravis B, FNP    Allergies    Naprosyn [naproxen]  Review of Systems   Review of Systems  Constitutional: Negative for fever.  Respiratory: Negative for shortness of breath.   Gastrointestinal: Positive for abdominal pain and vomiting. Negative for nausea.  Genitourinary: Positive for vaginal bleeding. Negative for dysuria and vaginal discharge.  Neurological: Negative for dizziness.  All other systems reviewed and are negative.   Physical Exam Updated Vital Signs BP 136/77   Pulse 80   Temp 98.4 F (36.9 C) (Oral)   Resp 17  SpO2 98%   Physical Exam Vitals and nursing note reviewed.  Constitutional:      Appearance: She is well-developed. She is obese. She is not ill-appearing.  HENT:     Head: Normocephalic and atraumatic.     Mouth/Throat:     Mouth: Mucous membranes are moist.  Eyes:     Pupils: Pupils are equal, round, and reactive to light.  Cardiovascular:     Rate and Rhythm: Normal rate and regular rhythm.     Heart sounds: Normal heart sounds.  Pulmonary:     Effort: Pulmonary effort is normal. No respiratory distress.     Breath sounds: No wheezing.  Abdominal:     General: Bowel sounds are normal.     Palpations: Abdomen is soft.     Tenderness: There is no abdominal  tenderness. There is no guarding or rebound.  Genitourinary:    Comments: Deferred Musculoskeletal:     Cervical back: Neck supple.  Skin:    General: Skin is warm and dry.  Neurological:     Mental Status: She is alert and oriented to person, place, and time.  Psychiatric:        Mood and Affect: Mood normal.     ED Results / Procedures / Treatments   Labs (all labs ordered are listed, but only abnormal results are displayed) Labs Reviewed  URINALYSIS, ROUTINE W REFLEX MICROSCOPIC - Abnormal; Notable for the following components:      Result Value   APPearance HAZY (*)    Hgb urine dipstick LARGE (*)    Ketones, ur 5 (*)    Protein, ur 100 (*)    Leukocytes,Ua SMALL (*)    RBC / HPF >50 (*)    Bacteria, UA FEW (*)    All other components within normal limits  I-STAT CHEM 8, ED - Abnormal; Notable for the following components:   Calcium, Ion 1.13 (*)    TCO2 21 (*)    All other components within normal limits  URINE CULTURE  I-STAT BETA HCG BLOOD, ED ([facility], [facility], [facility] ONLY)    EKG None  Radiology No results found.  Procedures Procedures (including critical care time)  Medications Ordered in ED Medications  ketorolac (TORADOL) 30 MG/ML injection 30 mg (30 mg Intramuscular Given 03/18/19 0413)    ED Course  I have reviewed the triage vital signs and the nursing notes.  Pertinent labs & imaging results that were available during my care of the patient were reviewed by me and considered in my medical decision making (see chart for details).    MDM Rules/Calculators/A&P                      Patient presents with vaginal bleeding.  Overall nontoxic-appearing vital signs are reassuring.  Onset of bleeding yesterday morning.  Pregnancy test is negative.  She has some midline cramping and back pain.  No lateralizing symptoms.  No tenderness on exam or signs of peritonitis.  Chem-8 obtained.  Hemoglobin is 12.6.  Urinalysis has large hemoglobin, 21-50 white cells, few  bacteria.  Will send urine culture.  Given suprapubic discomfort, patient could have a UTI.  With negative pregnancy test, low suspicion for ectopic as patient also does not have any lateralizing symptoms.  She improved with Toradol.  We discussed bleeding precautions and return instructions.  After history, exam, and medical workup I feel the patient has been appropriately medically screened and is safe for discharge home. Pertinent diagnoses were  discussed with the patient. Patient was given return precautions.  Final Clinical Impression(s) / ED Diagnoses Final diagnoses:  Menorrhagia with irregular cycle    Rx / DC Orders ED Discharge Orders         Ordered    ibuprofen (ADVIL) 600 MG tablet  Every 6 hours PRN     03/18/19 0517           Shon Baton, MD 03/18/19 [phone]

## 2019-03-18 NOTE — ED Triage Notes (Signed)
Patient here from home with complaints of vaginal bleeding and lower back pain that started today. Reports being 18 days late on period. Denies positive pregnancy test. Reports using 1 pad per hour.

## 2019-03-18 NOTE — ED Notes (Signed)
Pelvic cart at bedside. 

## 2019-03-18 NOTE — Discharge Instructions (Addendum)
You were seen today for menorrhagia.  You are not pregnant.  Take scheduled anti-inflammatories.  If you bleed through more than 1 pad per hour, you should be reevaluated.

## 2019-03-19 LAB — URINE CULTURE

## 2019-05-05 ENCOUNTER — Encounter (HOSPITAL_COMMUNITY): Payer: Self-pay

## 2019-05-05 ENCOUNTER — Emergency Department (HOSPITAL_COMMUNITY)
Admission: EM | Admit: 2019-05-05 | Discharge: 2019-05-05 | Disposition: A | Discharge status: Home / Self Care | Payer: 59 | Attending: Emergency Medicine | Admitting: Emergency Medicine

## 2019-05-05 ENCOUNTER — Other Ambulatory Visit: Payer: Self-pay

## 2019-05-05 DIAGNOSIS — N12 Tubulo-interstitial nephritis, not specified as acute or chronic: Secondary | ICD-10-CM | POA: Insufficient documentation

## 2019-05-05 DIAGNOSIS — R509 Fever, unspecified: Secondary | ICD-10-CM

## 2019-05-05 DIAGNOSIS — Z20822 Contact with and (suspected) exposure to covid-19: Secondary | ICD-10-CM | POA: Insufficient documentation

## 2019-05-05 LAB — COMPREHENSIVE METABOLIC PANEL
ALT: 75 U/L — ABNORMAL HIGH (ref 0–44)
AST: 149 U/L — ABNORMAL HIGH (ref 15–41)
Albumin: 3.5 g/dL (ref 3.5–5.0)
Alkaline Phosphatase: 64 U/L (ref 38–126)
Anion gap: 11 (ref 5–15)
BUN: 10 mg/dL (ref 6–20)
CO2: 20 mmol/L — ABNORMAL LOW (ref 22–32)
Calcium: 8.7 mg/dL — ABNORMAL LOW (ref 8.9–10.3)
Chloride: 103 mmol/L (ref 98–111)
Creatinine, Ser: 0.86 mg/dL (ref 0.44–1.00)
GFR calc Af Amer: >60 mL/min (ref >60)
GFR calc non Af Amer: >60 mL/min (ref >60)
Glucose, Bld: 113 mg/dL — ABNORMAL HIGH (ref 70–99)
Potassium: 3.2 mmol/L — ABNORMAL LOW (ref 3.5–5.1)
Sodium: 134 mmol/L — ABNORMAL LOW (ref 135–145)
Total Bilirubin: 1.1 mg/dL (ref 0.3–1.2)
Total Protein: 7.3 g/dL (ref 6.5–8.1)

## 2019-05-05 LAB — URINALYSIS, ROUTINE W REFLEX MICROSCOPIC
Bilirubin Urine: NEGATIVE
Glucose, UA: NEGATIVE mg/dL
Ketones, ur: 20 mg/dL — AB
Nitrite: POSITIVE — AB
Protein, ur: 100 mg/dL — AB
Specific Gravity, Urine: 1.021 (ref 1.005–1.030)
WBC, UA: >50 WBC/hpf — ABNORMAL HIGH (ref 0–5)
pH: 5 (ref 5.0–8.0)

## 2019-05-05 LAB — CBC WITH DIFFERENTIAL/PLATELET
Abs Immature Granulocytes: 0.06 10*3/uL (ref 0.00–0.07)
Basophils Absolute: 0 10*3/uL (ref 0.0–0.1)
Basophils Relative: 0 %
Eosinophils Absolute: 0 10*3/uL (ref 0.0–0.5)
Eosinophils Relative: 0 %
HCT: 37.6 % (ref 36.0–46.0)
Hemoglobin: 11.2 g/dL — ABNORMAL LOW (ref 12.0–15.0)
Immature Granulocytes: 1 %
Lymphocytes Relative: 9 %
Lymphs Abs: 0.7 10*3/uL (ref 0.7–4.0)
MCH: 25.9 pg — ABNORMAL LOW (ref 26.0–34.0)
MCHC: 29.8 g/dL — ABNORMAL LOW (ref 30.0–36.0)
MCV: 86.8 fL (ref 80.0–100.0)
Monocytes Absolute: 0.7 10*3/uL (ref 0.1–1.0)
Monocytes Relative: 9 %
Neutro Abs: 6.2 10*3/uL (ref 1.7–7.7)
Neutrophils Relative %: 81 %
Platelets: 240 10*3/uL (ref 150–400)
RBC: 4.33 MIL/uL (ref 3.87–5.11)
RDW: 18.5 % — ABNORMAL HIGH (ref 11.5–15.5)
WBC: 7.7 10*3/uL (ref 4.0–10.5)
nRBC: 0 % (ref 0.0–0.2)

## 2019-05-05 LAB — LIPASE, BLOOD: Lipase: 25 U/L (ref 11–51)

## 2019-05-05 LAB — SARS CORONAVIRUS 2 (TAT 6-24 HRS): SARS Coronavirus 2: NEGATIVE

## 2019-05-05 LAB — PREGNANCY, URINE: Preg Test, Ur: NEGATIVE

## 2019-05-05 MED ORDER — SODIUM CHLORIDE 0.9 % IV SOLN
1.0000 g | Freq: Once | INTRAVENOUS | Status: AC
  Administered 2019-05-05: 14:00:00 1 g via INTRAVENOUS
  Filled 2019-05-05: qty 10

## 2019-05-05 MED ORDER — SODIUM CHLORIDE 0.9 % IV BOLUS
1000.0000 mL | Freq: Once | INTRAVENOUS | Status: AC
  Administered 2019-05-05: 14:00:00 1000 mL via INTRAVENOUS

## 2019-05-05 MED ORDER — CIPROFLOXACIN HCL 500 MG PO TABS: 500.0000 mg | ORAL_TABLET | Freq: Two times a day (BID) | ORAL | 0 refills | Status: AC

## 2019-05-05 MED ORDER — MORPHINE SULFATE (PF) 4 MG/ML IV SOLN
4.0000 mg | Freq: Once | INTRAVENOUS | Status: AC
  Administered 2019-05-05: 12:00:00 4 mg via INTRAVENOUS
  Filled 2019-05-05: qty 1

## 2019-05-05 MED ORDER — ACETAMINOPHEN 325 MG PO TABS: 650.0000 mg | ORAL_TABLET | Freq: Once | ORAL | Status: DC | PRN

## 2019-05-05 MED ORDER — ONDANSETRON HCL 4 MG/2ML IJ SOLN
4.0000 mg | Freq: Once | INTRAMUSCULAR | Status: AC
  Administered 2019-05-05: 4 mg via INTRAVENOUS
  Filled 2019-05-05: qty 2

## 2019-05-05 MED ORDER — SODIUM CHLORIDE 0.9 % IV BOLUS
1000.0000 mL | Freq: Once | INTRAVENOUS | Status: AC
  Administered 2019-05-05: 12:00:00 1000 mL via INTRAVENOUS

## 2019-05-05 MED ORDER — IBUPROFEN 800 MG PO TABS
800.0000 mg | ORAL_TABLET | Freq: Once | ORAL | Status: AC
  Administered 2019-05-05: 800 mg via ORAL
  Filled 2019-05-05: qty 1

## 2019-05-05 MED ORDER — IBUPROFEN 800 MG PO TABS
800.0000 mg | ORAL_TABLET | Freq: Once | ORAL | Status: DC
Start: 2019-05-05 — End: 2019-05-05

## 2019-05-05 MED ORDER — ONDANSETRON 4 MG PO TBDP: 4.0000 mg | ORAL_TABLET | Freq: Three times a day (TID) | ORAL | 0 refills | Status: DC | PRN

## 2019-05-05 MED ORDER — CIPROFLOXACIN HCL 500 MG PO TABS: 500.0000 mg | ORAL_TABLET | Freq: Two times a day (BID) | ORAL | 0 refills | Status: DC

## 2019-05-05 NOTE — ED Notes (Signed)
Pt verbalizes understanding of DC instructions. Pt belongings returned and is ambulatory out of ED.  

## 2019-05-05 NOTE — Discharge Instructions (Addendum)
Please follow-up with your primary care doctor.  Also recommend that you follow-up with nephrology and have provided information for them that you can follow-up with them.  Since you have had numerous episodes of pyelonephritis you may have a structural issue of your bladder which could be predisposing you to frequent episodes of kidney infections.

## 2019-05-05 NOTE — ED Provider Notes (Signed)
[facility]-EMERGENCY DEPT Provider Note   CSN: [id number] Arrival date & time: 05/05/19  1020     History Chief Complaint  Patient presents with  . Fever  . Cough  . Generalized Body Aches    Holly Collier is a 34 y.o. female.  HPI Patient is a 34 year old female with a history of frequent UTIs presented today with right flank pain, fever, chills, urgency with no frequency or dysuria.  Patient states her flank pain is sharp, constant, radiates to her right pelvis.  She denies any history of kidney stones.   Patient states that her symptoms feel similar to prior UTI she is in the past and she states that she has had multiple episodes of pyelonephritis.      History reviewed. No pertinent past medical history.  Patient Active Problem List   Diagnosis Date Noted  . Seasonal allergies 07/13/2017  . Panic disorder with agoraphobia   . MDD (major depressive disorder), single episode, severe , no psychosis (HCC) 07/10/2017    Past Surgical History:  Procedure Laterality Date  . GASTRIC BYPASS    . shoulder Right      OB History   No obstetric history on file.     Family History  Problem Relation Age of Onset  . Cancer Mother   . Diabetes Father     Social History   Tobacco Use  . Smoking status: Current Some Day Smoker    Packs/day: 0.50    Types: Cigarettes  . Smokeless tobacco: Never Used  Substance Use Topics  . Alcohol use: Not Currently  . Drug use: No    Home Medications Prior to Admission medications   Medication Sig Start Date End Date Taking? Authorizing Provider  busPIRone (BUSPAR) 7.5 MG tablet Take 1 tablet (7.5 mg total) by mouth 2 (two) times daily. For anxiety 07/15/17   Money, Gerlene Burdock, FNP  cyclobenzaprine (FLEXERIL) 5 MG tablet Take 1 tablet (5 mg total) by mouth 3 (three) times daily as needed for muscle spasms. 07/15/17   Money, Gerlene Burdock, FNP  hydrOXYzine (ATARAX/VISTARIL) 25 MG tablet Take 1 tablet (25 mg  total) by mouth 3 (three) times daily as needed for anxiety. 07/15/17   Money, Gerlene Burdock, FNP  ibuprofen (ADVIL) 600 MG tablet Take 1 tablet (600 mg total) by mouth every 6 (six) hours as needed. 03/18/19   Horton, Mayer Masker, MD  nitrofurantoin, macrocrystal-monohydrate, (MACROBID) 100 MG capsule Take 1 capsule (100 mg total) by mouth 2 (two) times daily. 03/18/19   Horton, Mayer Masker, MD  sertraline (ZOLOFT) 50 MG tablet Take 1 tablet (50 mg total) by mouth daily. For mood control 07/16/17   Money, Gerlene Burdock, FNP  traZODone (DESYREL) 50 MG tablet Take 1 tablet (50 mg total) by mouth at bedtime as needed for sleep. 07/15/17   Money, Gerlene Burdock, FNP    Allergies    Naprosyn [naproxen]  Review of Systems   Review of Systems  Physical Exam Updated Vital Signs BP (!) 141/89 (BP Location: Left Arm)   Pulse (!) 127   Temp (!) 102 F (38.9 C)   Resp 20   Ht 5\' 8"  (1.727 m)   Wt (!) 140.6 kg   SpO2 98%   BMI 47.14 kg/m   Physical Exam Vitals and nursing note reviewed.  Constitutional:      General: She is not in acute distress.    Comments: Appears fatigued.  Nontoxic appearing.  HENT:  Head: Normocephalic and atraumatic.     Nose: Nose normal.  Eyes:     General: No scleral icterus. Cardiovascular:     Rate and Rhythm: Regular rhythm. Tachycardia present.     Pulses: Normal pulses.     Heart sounds: Normal heart sounds.     Comments: Tachycardia rate of 120 Pulmonary:     Effort: Pulmonary effort is normal. No respiratory distress.     Breath sounds: No wheezing.  Abdominal:     Palpations: Abdomen is soft.     Tenderness: There is no abdominal tenderness. There is right CVA tenderness. There is no left CVA tenderness or guarding.     Hernia: No hernia is present.  Musculoskeletal:     Cervical back: Normal range of motion.     Right lower leg: No edema.     Left lower leg: No edema.  Skin:    General: Skin is warm and dry.     Capillary Refill: Capillary refill takes less  than 2 seconds.  Neurological:     Mental Status: She is alert. Mental status is at baseline.  Psychiatric:        Mood and Affect: Mood normal.        Behavior: Behavior normal.     ED Results / Procedures / Treatments   Labs (all labs ordered are listed, but only abnormal results are displayed) Labs Reviewed  CBC WITH DIFFERENTIAL/PLATELET - Abnormal; Notable for the following components:      Result Value   Hemoglobin 11.2 (*)    MCH 25.9 (*)    MCHC 29.8 (*)    RDW 18.5 (*)    All other components within normal limits  COMPREHENSIVE METABOLIC PANEL - Abnormal; Notable for the following components:   Sodium 134 (*)    Potassium 3.2 (*)    CO2 20 (*)    Glucose, Bld 113 (*)    Calcium 8.7 (*)    AST 149 (*)    ALT 75 (*)    All other components within normal limits  URINALYSIS, ROUTINE W REFLEX MICROSCOPIC - Abnormal; Notable for the following components:   APPearance CLOUDY (*)    Hgb urine dipstick MODERATE (*)    Ketones, ur 20 (*)    Protein, ur 100 (*)    Nitrite POSITIVE (*)    Leukocytes,Ua MODERATE (*)    WBC, UA >50 (*)    Bacteria, UA FEW (*)    Non Squamous Epithelial 0-5 (*)    All other components within normal limits  URINE CULTURE  SARS CORONAVIRUS 2 (TAT 6-24 HRS)  LIPASE, BLOOD  PREGNANCY, URINE    EKG None  Radiology No results found.  Procedures Procedures (including critical care time)  Medications Ordered in ED Medications  ondansetron (ZOFRAN) injection 4 mg (4 mg Intravenous Given 05/05/19 1144)  morphine 4 MG/ML injection 4 mg (4 mg Intravenous Given 05/05/19 1145)  ibuprofen (ADVIL) tablet 800 mg (800 mg Oral Given 05/05/19 1134)  sodium chloride 0.9 % bolus 1,000 mL (0 mLs Intravenous Stopped 05/05/19 1250)    ED Course  I have reviewed the triage vital signs and the nursing notes.  Pertinent labs & imaging results that were available during my care of the patient were reviewed by me and considered in my medical decision making  (see chart for details).    MDM Rules/Calculators/A&P  Patient is a 34 year old female with a history of UTIs and pyelonephritis presented today with fevers, right flank pain, urinary urgency and hesitancy.  Patient's physical exam is remarkable for temperature of 103 was 104 at home.  Tachycardia 120 and generalized fatigue appearance.  She also has dry mucous membranes.  Initially considered urosepsis however patient is well-appearing and CBC is without leukocytosis.  Her pulse rate improved to 90 with 1 L of normal saline and 800 mg of ibuprofen.  Her temperature is also improved to 100.7.  She is much improved and tolerated p.o. with fluid challenge.  She is able to take her medications and cannot take antipyretics which is likely the cause of her tachycardia.  No indication for sepsis work-up or admission.  Patient given strict return precautions however.   Patient's urine is infected with bacteria, nitrates and leukocytes.  No leukocytosis.  She has mild elevated AST/ALT.  Discussed with patient she has no right upper quadrant abdominal pain.  She will follow up with her PCP for reassessment and recheck of labs within the next month.  We will provide patient with one dose of ceftriaxone additional 1 L of normal saline.  Will discharge patient with ciprofloxacin for pyelonephritis and Zofran for nausea.  She will be provided with instructions for Tylenol and ibuprofen use.  I recommend the patient follow-up with urology as she may have a structural urinary disease such as reflux.   I discussed this case with my attending physician who cosigned this note including patient's presenting symptoms, physical exam, and planned diagnostics and interventions. Attending physician stated agreement with plan or made changes to plan which were implemented.      The medical records were personally reviewed by myself. I personally reviewed all lab results and interpreted all imaging  studies and either concurred with their official read or contacted radiology for clarification.   This patient appears reasonably screened and I doubt any other medical condition requiring further workup, evaluation, or treatment in the ED at this time prior to discharge.   Patient's vitals are WNL apart from vital sign abnormalities discussed above, patient is in NAD, and able to ambulate in the ED at their baseline and able to tolerate PO.  Pain has been managed or a plan has been made for home management and has no complaints prior to discharge. Patient is comfortable with above plan and for discharge at this time. All questions were answered prior to disposition. Results from the ER workup discussed with the patient face to face and all questions answered to the best of my ability. The patient is safe for discharge with strict return precautions. Patient appears safe for discharge with appropriate follow-up. Conveyed my impression with the patient and they voiced understanding and are agreeable to plan.   An After Visit Summary was printed and given to the patient.  Portions of this note were generated with Scientist, clinical (histocompatibility and immunogenetics). Dictation errors may occur despite best attempts at proofreading.   Holly Collier was evaluated in Emergency Department on 05/05/2019 for the symptoms described in the history of present illness. She was evaluated in the context of the global COVID-19 pandemic, which necessitated consideration that the patient might be at risk for infection with the SARS-CoV-2 virus that causes COVID-19. Institutional protocols and algorithms that pertain to the evaluation of patients at risk for COVID-19 are in a state of rapid change based on information released by regulatory bodies including the CDC and federal and state organizations. These  policies and algorithms were followed during the patient's care in the ED.  Final Clinical Impression(s) / ED Diagnoses Final  diagnoses:  Pyelonephritis  Fever, unspecified fever cause    Rx / DC Orders ED Discharge Orders    None       Tedd Sias, [place] 05/05/19 1354    Blanchie Dessert, MD 05/05/19 2126

## 2019-05-05 NOTE — ED Triage Notes (Signed)
Patient c/o fever, body aches, cough x 6 days. Patient states she tested Negative for Covid 6 days ago. Patient started having a fever 2 days ago.  Patient also c/o lower back pain and voiding frequent small amounts x 2 days. Patient reports a history of UTIs

## 2019-05-07 LAB — URINE CULTURE: Culture: >=100000 — AB

## 2019-05-08 ENCOUNTER — Telehealth: Payer: Self-pay | Admitting: Emergency Medicine

## 2019-05-08 NOTE — Telephone Encounter (Signed)
Post ED Visit - Positive Culture Follow-up  Culture report reviewed by antimicrobial stewardship pharmacist: [facility] []  , Pharm.D. []  Enzo Bi, Pharm.D., BCPS AQ-ID []  , Pharm.D., BCPS []  Celedonio Miyamoto, Pharm.D., BCPS []  [place], Garvin Fila.D., BCPS, AAHIVP []  , Pharm.D., BCPS, AAHIVP []  Georgina Pillion, PharmD, BCPS []  , PharmD, BCPS []  [place], PharmD, BCPS []  [address], PharmD []  , PharmD, BCPS []  Estella Husk, PharmD  Pharmacy Team []  Lysle Pearl, PharmD []  , PharmD []  Phillips Climes, PharmD []  , Rph []  Agapito Games) , PharmD []  Verlan Friends, PharmD []  , PharmD []  Mervyn Gay, PharmD []  , PharmD []  Vinnie Level, PharmD []  [facility], PharmD []  , PharmD [x]  Len Childs, PharmD   Positive urine culture Treated with Ciproflloxacin, organism sensitive to the same and no further patient follow-up is required at this time.  Bridgitt Raggio 05/08/2019, 3:30 PM

## 2019-08-14 ENCOUNTER — Emergency Department (HOSPITAL_COMMUNITY)
Admission: EM | Admit: 2019-08-14 | Discharge: 2019-08-15 | Disposition: A | Discharge status: Home / Self Care | Payer: 59 | Attending: Emergency Medicine | Admitting: Emergency Medicine

## 2019-08-14 ENCOUNTER — Encounter (HOSPITAL_COMMUNITY): Payer: Self-pay

## 2019-08-14 ENCOUNTER — Other Ambulatory Visit: Payer: Self-pay

## 2019-08-14 DIAGNOSIS — R1010 Upper abdominal pain, unspecified: Secondary | ICD-10-CM | POA: Diagnosis not present

## 2019-08-14 DIAGNOSIS — K760 Fatty (change of) liver, not elsewhere classified: Secondary | ICD-10-CM

## 2019-08-14 DIAGNOSIS — R7401 Elevation of levels of liver transaminase levels: Secondary | ICD-10-CM

## 2019-08-14 DIAGNOSIS — Z79899 Other long term (current) drug therapy: Secondary | ICD-10-CM | POA: Diagnosis not present

## 2019-08-14 DIAGNOSIS — F17210 Nicotine dependence, cigarettes, uncomplicated: Secondary | ICD-10-CM | POA: Insufficient documentation

## 2019-08-14 LAB — URINALYSIS, ROUTINE W REFLEX MICROSCOPIC
Bilirubin Urine: NEGATIVE
Glucose, UA: NEGATIVE mg/dL
Ketones, ur: 5 mg/dL — AB
Leukocytes,Ua: NEGATIVE
Nitrite: NEGATIVE
Protein, ur: 30 mg/dL — AB
Specific Gravity, Urine: 1.006 (ref 1.005–1.030)
pH: 7 (ref 5.0–8.0)

## 2019-08-14 LAB — COMPREHENSIVE METABOLIC PANEL
ALT: 92 U/L — ABNORMAL HIGH (ref 0–44)
AST: 89 U/L — ABNORMAL HIGH (ref 15–41)
Albumin: 4.7 g/dL (ref 3.5–5.0)
Alkaline Phosphatase: 83 U/L (ref 38–126)
Anion gap: 14 (ref 5–15)
BUN: 7 mg/dL (ref 6–20)
CO2: 24 mmol/L (ref 22–32)
Calcium: 9.1 mg/dL (ref 8.9–10.3)
Chloride: 95 mmol/L — ABNORMAL LOW (ref 98–111)
Creatinine, Ser: 0.79 mg/dL (ref 0.44–1.00)
GFR calc Af Amer: >60 mL/min (ref >60)
GFR calc non Af Amer: >60 mL/min (ref >60)
Glucose, Bld: 119 mg/dL — ABNORMAL HIGH (ref 70–99)
Potassium: 3.7 mmol/L (ref 3.5–5.1)
Sodium: 133 mmol/L — ABNORMAL LOW (ref 135–145)
Total Bilirubin: 2.5 mg/dL — ABNORMAL HIGH (ref 0.3–1.2)
Total Protein: 8.9 g/dL — ABNORMAL HIGH (ref 6.5–8.1)

## 2019-08-14 LAB — CBC
HCT: 46 % (ref 36.0–46.0)
Hemoglobin: 14.7 g/dL (ref 12.0–15.0)
MCH: 28.1 pg (ref 26.0–34.0)
MCHC: 32 g/dL (ref 30.0–36.0)
MCV: 88 fL (ref 80.0–100.0)
Platelets: 358 10*3/uL (ref 150–400)
RBC: 5.23 MIL/uL — ABNORMAL HIGH (ref 3.87–5.11)
RDW: 19 % — ABNORMAL HIGH (ref 11.5–15.5)
WBC: 7.9 10*3/uL (ref 4.0–10.5)
nRBC: 0 % (ref 0.0–0.2)

## 2019-08-14 LAB — HCG, QUANTITATIVE, PREGNANCY: hCG, Beta Chain, Quant, S: <1 m[IU]/mL (ref <5)

## 2019-08-14 LAB — LIPASE, BLOOD: Lipase: 24 U/L (ref 11–51)

## 2019-08-14 MED ORDER — SODIUM CHLORIDE 0.9% FLUSH
3.0000 mL | Freq: Once | INTRAVENOUS | Status: AC
  Administered 2019-08-15: 3 mL via INTRAVENOUS

## 2019-08-14 MED ORDER — METOCLOPRAMIDE HCL 5 MG/ML IJ SOLN
10.0000 mg | Freq: Once | INTRAMUSCULAR | Status: AC | PRN
  Administered 2019-08-14: 10 mg via INTRAVENOUS
  Filled 2019-08-14: qty 2

## 2019-08-14 MED ORDER — ONDANSETRON 4 MG PO TBDP
4.0000 mg | ORAL_TABLET | Freq: Once | ORAL | Status: AC | PRN
  Administered 2019-08-14: 4 mg via ORAL
  Filled 2019-08-14: qty 1

## 2019-08-14 MED ORDER — SODIUM CHLORIDE 0.9 % IV BOLUS
1000.0000 mL | Freq: Once | INTRAVENOUS | Status: AC
  Administered 2019-08-14: 1000 mL via INTRAVENOUS

## 2019-08-14 MED ORDER — FENTANYL CITRATE (PF) 100 MCG/2ML IJ SOLN
100.0000 ug | INTRAMUSCULAR | Status: DC | PRN
  Administered 2019-08-14: 100 ug via INTRAVENOUS
  Filled 2019-08-14: qty 2

## 2019-08-14 NOTE — ED Triage Notes (Signed)
Pt states had N/V, fever at the highest of 101 and aches and stabbing pains through her abdomen x2 days ago. Pt states she has been taking tylenol for fever and aches.

## 2019-08-14 NOTE — ED Provider Notes (Signed)
[facility]-EMERGENCY DEPT Provider Note   CSN: [id number] Arrival date & time: 08/14/19  1928     History Chief Complaint  Patient presents with  . Abdominal Pain    with c/o fever and N/V    Addilyne Backs is a 34 y.o. female with a history of alcohol use disorder and pyelonephritis who presents the emergency department with a chief complaint of abdominal pain.  The patient reports that she developed some right-sided, achy, low flank and back pain several days ago that has gradually become bilateral low back pain over the last couple of days.  States that two nights ago that she drank a considerable amount of red wine after being abstinent from alcohol for many months and reports that yesterday she was feeling poorly, but had attributed it to drinking alcohol the night prior until she developed a fever later in the night.  T-max 101.7, and chills.  She has been taking at 1000 mg of Tylenol every 8 hours for her symptoms.  Fever has persisted today, but has been well controlled with Tylenol, but she also developed sharp, shooting pain in her left upper back that radiates around to her upper abdomen.  She reports that since the onset of the upper abdominal pain that she has been having more frequent episodes, sometimes as frequently as 2-3 times per hour, which prompted her visit to the ER tonight.  She also reports that she has had countless episodes of nonbloody, nonbilious vomiting and nausea all day today.  Associated symptoms include urinary hesitancy.  She has noticed that she has had black stools over the last 48 hours, but suspects that this is secondary to drinking wine.  No previous episodes of hematochezia, melena, or hematemesis.  She denies cough, shortness of breath, vaginal bleeding or discharge, dysuria, hematuria, diarrhea, constipation, rash, headache.   Abdominal surgery includes Roux-en-Y gastric bypass approximately 10 years ago.  No history of  nephrolithiasis.  No recent NSAID use.  She is a current, every day tobacco user, approximately 4 to 5 cigarettes daily.  Denies other illicit or recreational substance use.  She has no concerns for pregnancy at this time.  No concern for STIs.   The history is provided by the patient. No language interpreter was used.       History reviewed. No pertinent past medical history.  Patient Active Problem List   Diagnosis Date Noted  . Seasonal allergies 07/13/2017  . Panic disorder with agoraphobia   . MDD (major depressive disorder), single episode, severe , no psychosis (HCC) 07/10/2017    Past Surgical History:  Procedure Laterality Date  . GASTRIC BYPASS    . shoulder Right      OB History   No obstetric history on file.     Family History  Problem Relation Age of Onset  . Cancer Mother   . Diabetes Father     Social History   Tobacco Use  . Smoking status: Current Some Day Smoker    Packs/day: 0.50    Types: Cigarettes  . Smokeless tobacco: Never Used  Substance Use Topics  . Alcohol use: Not Currently  . Drug use: No    Home Medications Prior to Admission medications   Medication Sig Start Date End Date Taking? Authorizing Provider  busPIRone (BUSPAR) 7.5 MG tablet Take 1 tablet (7.5 mg total) by mouth 2 (two) times daily. For anxiety 07/15/17   Money, Gerlene Burdock, FNP  cyclobenzaprine (FLEXERIL) 5 MG tablet Take 1  tablet (5 mg total) by mouth 3 (three) times daily as needed for muscle spasms. 07/15/17   Money, Gerlene Burdock, FNP  hydrOXYzine (ATARAX/VISTARIL) 25 MG tablet Take 1 tablet (25 mg total) by mouth 3 (three) times daily as needed for anxiety. 07/15/17   Money, Gerlene Burdock, FNP  ibuprofen (ADVIL) 600 MG tablet Take 1 tablet (600 mg total) by mouth every 6 (six) hours as needed. 03/18/19   Horton, Mayer Masker, MD  nitrofurantoin, macrocrystal-monohydrate, (MACROBID) 100 MG capsule Take 1 capsule (100 mg total) by mouth 2 (two) times daily. 03/18/19   Horton, Mayer Masker, MD  ondansetron (ZOFRAN ODT) 4 MG disintegrating tablet Take 1 tablet (4 mg total) by mouth every 8 (eight) hours as needed for nausea or vomiting. 05/05/19   Gailen Shelter, PA  oxyCODONE-acetaminophen (PERCOCET/ROXICET) 5-325 MG tablet Take 1 tablet by mouth every 8 (eight) hours as needed for severe pain. 08/15/19   Rima Blizzard A, PA-C  promethazine (PHENERGAN) 25 MG tablet Take 1 tablet (25 mg total) by mouth every 6 (six) hours as needed for nausea or vomiting. 08/15/19   Kemisha Bonnette A, PA-C  sertraline (ZOLOFT) 50 MG tablet Take 1 tablet (50 mg total) by mouth daily. For mood control 07/16/17   Money, Gerlene Burdock, FNP  traZODone (DESYREL) 50 MG tablet Take 1 tablet (50 mg total) by mouth at bedtime as needed for sleep. 07/15/17   Money, Gerlene Burdock, FNP    Allergies    Patient has no active allergies.  Review of Systems   Review of Systems  Constitutional: Positive for chills and fever. Negative for activity change.  Respiratory: Negative for shortness of breath and wheezing.   Cardiovascular: Negative for chest pain and palpitations.  Gastrointestinal: Positive for abdominal pain, nausea and vomiting. Negative for abdominal distention, anal bleeding, blood in stool, constipation and diarrhea.  Genitourinary: Positive for difficulty urinating and flank pain. Negative for dysuria, hematuria, urgency, vaginal bleeding, vaginal discharge and vaginal pain.  Musculoskeletal: Positive for back pain. Negative for myalgias, neck pain and neck stiffness.  Skin: Negative for rash.  Allergic/Immunologic: Negative for immunocompromised state.  Neurological: Negative for headaches.  Psychiatric/Behavioral: Negative for confusion.    Physical Exam Updated Vital Signs BP (!) 136/102   Pulse 95   Temp 98.4 F (36.9 C) (Oral)   Resp 15   Ht 5\' 8"  (1.727 m)   Wt (!) 136.5 kg   SpO2 96%   BMI 45.77 kg/m   Physical Exam Vitals and nursing note reviewed.  Constitutional:      General: She is  not in acute distress.    Appearance: She is not ill-appearing, toxic-appearing or diaphoretic.     Comments: Well-appearing.  Nontoxic.  No acute distress.  HENT:     Head: Normocephalic.  Eyes:     Conjunctiva/sclera: Conjunctivae normal.  Cardiovascular:     Rate and Rhythm: Regular rhythm. Tachycardia present.     Heart sounds: No murmur. No friction rub. No gallop.   Pulmonary:     Effort: Pulmonary effort is normal. No respiratory distress.     Breath sounds: No stridor. No wheezing, rhonchi or rales.  Chest:     Chest wall: No tenderness.  Abdominal:     General: There is no distension.     Palpations: Abdomen is soft. There is no mass.     Tenderness: There is abdominal tenderness. There is right CVA tenderness and left CVA tenderness. There is no guarding or rebound.  Hernia: No hernia is present.     Comments: Tender to palpation in the epigastric and left upper quadrant.  There is also mild right upper quadrant tenderness, but negative Murphy sign.  She has bilateral CVA tenderness, left greater than right.  Lower abdomen is nontender.  Abdomen is obese, but soft and nondistended.  Normoactive bowel sounds.  Musculoskeletal:     Cervical back: Neck supple.     Right lower leg: No edema.     Left lower leg: No edema.  Skin:    General: Skin is warm.     Coloration: Skin is not jaundiced.     Findings: No rash.  Neurological:     Mental Status: She is alert.  Psychiatric:        Behavior: Behavior normal.     ED Results / Procedures / Treatments   Labs (all labs ordered are listed, but only abnormal results are displayed) Labs Reviewed  COMPREHENSIVE METABOLIC PANEL - Abnormal; Notable for the following components:      Result Value   Sodium 133 (*)    Chloride 95 (*)    Glucose, Bld 119 (*)    Total Protein 8.9 (*)    AST 89 (*)    ALT 92 (*)    Total Bilirubin 2.5 (*)    All other components within normal limits  CBC - Abnormal; Notable for the  following components:   RBC 5.23 (*)    RDW 19.0 (*)    All other components within normal limits  URINALYSIS, ROUTINE W REFLEX MICROSCOPIC - Abnormal; Notable for the following components:   APPearance HAZY (*)    Hgb urine dipstick MODERATE (*)    Ketones, ur 5 (*)    Protein, ur 30 (*)    Bacteria, UA RARE (*)    All other components within normal limits  LIPASE, BLOOD  HCG, QUANTITATIVE, PREGNANCY  I-STAT BETA HCG BLOOD, ED ([facility], [facility], [facility] ONLY)    EKG None  Radiology CT ABDOMEN PELVIS W CONTRAST  Result Date: 08/15/2019 CLINICAL DATA:  Nausea vomiting fever EXAM: CT ABDOMEN AND PELVIS WITH CONTRAST TECHNIQUE: Multidetector CT imaging of the abdomen and pelvis was performed using the standard protocol following bolus administration of intravenous contrast. CONTRAST:  100mL OMNIPAQUE IOHEXOL 300 MG/ML  SOLN COMPARISON:  None. FINDINGS: Lower chest: The visualized heart size within normal limits. No pericardial fluid/thickening. No hiatal hernia. The visualized portions of the lungs are clear. Hepatobiliary: There is diffuse low density seen throughout the liver parenchyma.The main portal vein is patent. No evidence of calcified gallstones, gallbladder wall thickening or biliary dilatation. Pancreas: Unremarkable. No pancreatic ductal dilatation or surrounding inflammatory changes. Spleen: Normal in size without focal abnormality. Adrenals/Urinary Tract: Both adrenal glands appear normal. The kidneys and collecting system appear normal without evidence of urinary tract calculus or hydronephrosis. Bladder is unremarkable. Stomach/Bowel: The patient is status post Roux-en-Y gastric bypass. No inflammatory changes, wall thickening, or obstructive findings.The appendix is normal. Vascular/Lymphatic: There are no enlarged mesenteric, retroperitoneal, or pelvic lymph nodes. No significant vascular findings are present. Reproductive: The uterus and adnexa are unremarkable. Other: No evidence of  abdominal wall mass or hernia. Musculoskeletal: No acute or significant osseous findings. IMPRESSION: No acute intra-abdominal or pelvic pathology to explain the patient's symptoms. Hepatic steatosis Electronically Signed   By: Jonna ClarkBindu  Avutu M.D.   On: 08/15/2019 00:34   US Abdomen Limited RUQ  Result Date: 08/15/2019 CLINICAL DATA:  Elevated transaminase EXAM: ULTRASOUND ABDOMEN LIMITED RIGHT  UPPER QUADRANT COMPARISON:  None. FINDINGS: Gallbladder: No gallstones or wall thickening visualized. No sonographic Murphy sign noted by sonographer. Common bile duct: Not well visualized. Liver: Increased echotexture seen throughout. No focal abnormality or biliary ductal dilatation. Portal vein is patent on color Doppler imaging with normal direction of blood flow towards the liver. Other: None. IMPRESSION: Normal appearing gallbladder.  Hepatic steatosis Electronically Signed   By: Jonna Clark M.D.   On: 08/15/2019 02:21    Procedures Procedures (including critical care time)  Medications Ordered in ED Medications  fentaNYL (SUBLIMAZE) injection 100 mcg (100 mcg Intravenous Given 08/14/19 2300)  sodium chloride flush (NS) 0.9 % injection 3 mL (3 mLs Intravenous Given 08/15/19 0048)  ondansetron (ZOFRAN-ODT) disintegrating tablet 4 mg (4 mg Oral Given 08/14/19 2003)  sodium chloride 0.9 % bolus 1,000 mL (0 mLs Intravenous Stopped 08/15/19 0312)  metoCLOPramide (REGLAN) injection 10 mg (10 mg Intravenous Given 08/14/19 2300)  sodium chloride (PF) 0.9 % injection (  Given by Other 08/15/19 0052)  iohexol (OMNIPAQUE) 300 MG/ML solution 100 mL (100 mLs Intravenous Contrast Given 08/15/19 0014)  HYDROmorphone (DILAUDID) injection 1 mg (1 mg Intravenous Given 08/15/19 0047)  promethazine (PHENERGAN) injection 12.5 mg (12.5 mg Intravenous Given 08/15/19 0045)  ketorolac (TORADOL) 30 MG/ML injection 30 mg (30 mg Intravenous Given 08/15/19 1443)    ED Course  I have reviewed the triage vital signs and the nursing  notes.  Pertinent labs & imaging results that were available during my care of the patient were reviewed by me and considered in my medical decision making (see chart for details).    MDM Rules/Calculators/A&P                      34 year old female with a history of alcohol use disorder and pyelonephritis who presents to the emergency department with chief complaint of flank pain, back pain, abdominal pain, nausea, and vomiting, fever, generalized malaise.   Tachycardic in the 130s on arrival to the ER.  She is afebrile.  Mildly hypertensive, but no hypoxia. No rebound or guarding on abdominal exam.  The patient was seen and independently evaluated by Dr. Rodena Medin, attending physician. We will plan to order labs.  Given her history of reported fever and Roux-en-Y surgery, she will need a CT scan for further work-up and evaluation.  CBC concerning for hemoconcentration.  No leukocytosis.  There is a mild increase in transaminases, although this does appear improved from previous.  Total bilirubin is also elevated at 2.5.  Alkaline phosphatase and lipase are normal.  Pregnancy test is negative.  Urinalysis is not concerning for infection.  CT abdomen pelvis with hepatic steatosis, but no other acute findings.  Given persistent upper abdominal pain, right upper quadrant ultrasound was obtained.  Hepatic steatosis again seen on ultrasound.  This is likely the etiology of elevated total bilirubin.  Doubt choledocholithiasis, cholecystitis, pancreatitis, ruptured peptic ulcer, diverticulitis, UTI, pyelonephritis, PID, mesenteric ischemia, or URI.  Patient's pain is now well controlled.  Tachycardia has resolved.  She has persistently remained afebrile since arrival in the ER.  She has been able to tolerate fluids without difficulty.  Discussed at length that patient should abstain entirely from alcohol.  She is agreeable.  Will discharge home with a short course of pain medication and antiemetics.  She was  advised to follow-up with GI.  ER return precautions given.  She is hemodynamically stable and in no acute distress.  Safe for discharge home with outpatient  follow-up as indicated.  Final Clinical Impression(s) / ED Diagnoses Final diagnoses:  Elevated transaminase level  Upper abdominal pain  Hepatic steatosis    Rx / DC Orders ED Discharge Orders         Ordered    promethazine (PHENERGAN) 25 MG tablet  Every 6 hours PRN     08/15/19 0456    oxyCODONE-acetaminophen (PERCOCET/ROXICET) 5-325 MG tablet  Every 4 hours PRN,   Status:  Discontinued     08/15/19 0456    oxyCODONE-acetaminophen (PERCOCET/ROXICET) 5-325 MG tablet  Every 8 hours PRN     08/15/19 0458           Joline Maxcy A, PA-C 08/15/19 0711    Valarie Merino, MD 08/15/19 [phone]

## 2019-08-15 ENCOUNTER — Emergency Department (HOSPITAL_COMMUNITY): Payer: 59

## 2019-08-15 ENCOUNTER — Encounter (HOSPITAL_COMMUNITY): Payer: Self-pay

## 2019-08-15 MED ORDER — HYDROMORPHONE HCL 1 MG/ML IJ SOLN
1.0000 mg | Freq: Once | INTRAMUSCULAR | Status: AC
  Administered 2019-08-15: 1 mg via INTRAVENOUS
  Filled 2019-08-15: qty 1

## 2019-08-15 MED ORDER — PROMETHAZINE HCL 25 MG/ML IJ SOLN
12.5000 mg | Freq: Once | INTRAMUSCULAR | Status: AC
  Administered 2019-08-15: 12.5 mg via INTRAVENOUS
  Filled 2019-08-15: qty 1

## 2019-08-15 MED ORDER — PROMETHAZINE HCL 25 MG PO TABS
25.0000 mg | ORAL_TABLET | Freq: Four times a day (QID) | ORAL | 0 refills | Status: DC | PRN
Start: 2019-08-15 — End: 2019-09-10

## 2019-08-15 MED ORDER — OXYCODONE-ACETAMINOPHEN 5-325 MG PO TABS: 1.0000 | ORAL_TABLET | Freq: Three times a day (TID) | ORAL | 0 refills | Status: DC | PRN

## 2019-08-15 MED ORDER — KETOROLAC TROMETHAMINE 30 MG/ML IJ SOLN
30.0000 mg | Freq: Once | INTRAMUSCULAR | Status: AC
  Administered 2019-08-15: 30 mg via INTRAVENOUS
  Filled 2019-08-15: qty 1

## 2019-08-15 MED ORDER — OXYCODONE-ACETAMINOPHEN 5-325 MG PO TABS: 2.0000 | ORAL_TABLET | ORAL | 0 refills | Status: DC | PRN

## 2019-08-15 MED ORDER — SODIUM CHLORIDE (PF) 0.9 % IJ SOLN
INTRAMUSCULAR | Status: AC
  Filled 2019-08-15: qty 50

## 2019-08-15 MED ORDER — IOHEXOL 300 MG/ML  SOLN
100.0000 mL | Freq: Once | INTRAMUSCULAR | Status: AC | PRN
  Administered 2019-08-15: 100 mL via INTRAVENOUS

## 2019-08-15 NOTE — Discharge Instructions (Addendum)
Thank you for allowing me to care for you today in the Emergency Department.   Take 650 mg of Tylenol or 600 mg of ibuprofen with food every 6 hours for pain.  You can alternate between these 2 medications every 3 hours if your pain returns.  For instance, you can take Tylenol at noon, followed by a dose of ibuprofen at 3, followed by second dose of Tylenol and 6.  For severe, uncontrollable pain, you can take 1 tablet of Percocet every 8 hours as needed.  This is a narcotic.  It can be addicting.  You should only use it if you have severe pain.  Do not work or drive while taking this medication.  Take 1 tablet of Phenergan every 6 hours as needed for nausea or vomiting.  Call to schedule a follow-up with gastroenterology if your symptoms persist.  Make sure that you avoid alcohol entirely.  Return to the emergency department if you develop uncontrollable vomiting despite taking Phenergan, if your pain becomes unmanageable, despite the regimen above, if you stop making urine, or if you develop other new, concerning symptoms.

## 2019-08-15 NOTE — ED Notes (Signed)
Patient given PO fluid challenge, states she felt slightly nauseous but able to keep down.

## 2019-09-10 ENCOUNTER — Other Ambulatory Visit: Payer: Self-pay

## 2019-09-10 ENCOUNTER — Encounter (HOSPITAL_COMMUNITY): Payer: Self-pay | Admitting: Emergency Medicine

## 2019-09-10 ENCOUNTER — Emergency Department (HOSPITAL_COMMUNITY)
Admission: EM | Admit: 2019-09-10 | Discharge: 2019-09-10 | Disposition: A | Discharge status: Home / Self Care | Payer: 59 | Attending: Emergency Medicine | Admitting: Emergency Medicine

## 2019-09-10 ENCOUNTER — Emergency Department (HOSPITAL_COMMUNITY): Payer: 59

## 2019-09-10 DIAGNOSIS — R569 Unspecified convulsions: Secondary | ICD-10-CM

## 2019-09-10 DIAGNOSIS — Z79899 Other long term (current) drug therapy: Secondary | ICD-10-CM | POA: Diagnosis not present

## 2019-09-10 DIAGNOSIS — F10129 Alcohol abuse with intoxication, unspecified: Secondary | ICD-10-CM | POA: Insufficient documentation

## 2019-09-10 DIAGNOSIS — F1010 Alcohol abuse, uncomplicated: Secondary | ICD-10-CM

## 2019-09-10 DIAGNOSIS — F17210 Nicotine dependence, cigarettes, uncomplicated: Secondary | ICD-10-CM | POA: Insufficient documentation

## 2019-09-10 DIAGNOSIS — Y905 Blood alcohol level of 100-119 mg/100 ml: Secondary | ICD-10-CM | POA: Insufficient documentation

## 2019-09-10 DIAGNOSIS — S0990XA Unspecified injury of head, initial encounter: Secondary | ICD-10-CM

## 2019-09-10 LAB — URINALYSIS, ROUTINE W REFLEX MICROSCOPIC
Bilirubin Urine: NEGATIVE
Glucose, UA: NEGATIVE mg/dL
Hgb urine dipstick: NEGATIVE
Ketones, ur: NEGATIVE mg/dL
Leukocytes,Ua: NEGATIVE
Nitrite: NEGATIVE
Protein, ur: NEGATIVE mg/dL
Specific Gravity, Urine: 1.014 (ref 1.005–1.030)
pH: 5 (ref 5.0–8.0)

## 2019-09-10 LAB — CBC WITH DIFFERENTIAL/PLATELET
Abs Immature Granulocytes: 0.01 10*3/uL (ref 0.00–0.07)
Basophils Absolute: 0 10*3/uL (ref 0.0–0.1)
Basophils Relative: 1 %
Eosinophils Absolute: 0 10*3/uL (ref 0.0–0.5)
Eosinophils Relative: 1 %
HCT: 41.1 % (ref 36.0–46.0)
Hemoglobin: 13.2 g/dL (ref 12.0–15.0)
Immature Granulocytes: 0 %
Lymphocytes Relative: 48 %
Lymphs Abs: 2.3 10*3/uL (ref 0.7–4.0)
MCH: 27.9 pg (ref 26.0–34.0)
MCHC: 32.1 g/dL (ref 30.0–36.0)
MCV: 86.9 fL (ref 80.0–100.0)
Monocytes Absolute: 0.5 10*3/uL (ref 0.1–1.0)
Monocytes Relative: 10 %
Neutro Abs: 1.9 10*3/uL (ref 1.7–7.7)
Neutrophils Relative %: 40 %
Platelets: 270 10*3/uL (ref 150–400)
RBC: 4.73 MIL/uL (ref 3.87–5.11)
RDW: 18 % — ABNORMAL HIGH (ref 11.5–15.5)
WBC: 4.8 10*3/uL (ref 4.0–10.5)
nRBC: 0 % (ref 0.0–0.2)

## 2019-09-10 LAB — RAPID URINE DRUG SCREEN, HOSP PERFORMED
Amphetamines: NOT DETECTED
Barbiturates: NOT DETECTED
Benzodiazepines: NOT DETECTED
Cocaine: NOT DETECTED
Opiates: NOT DETECTED
Tetrahydrocannabinol: NOT DETECTED

## 2019-09-10 LAB — CBG MONITORING, ED: Glucose-Capillary: 108 mg/dL — ABNORMAL HIGH (ref 70–99)

## 2019-09-10 LAB — COMPREHENSIVE METABOLIC PANEL
ALT: 108 U/L — ABNORMAL HIGH (ref 0–44)
AST: 153 U/L — ABNORMAL HIGH (ref 15–41)
Albumin: 4.2 g/dL (ref 3.5–5.0)
Alkaline Phosphatase: 68 U/L (ref 38–126)
Anion gap: 16 — ABNORMAL HIGH (ref 5–15)
BUN: 8 mg/dL (ref 6–20)
CO2: 20 mmol/L — ABNORMAL LOW (ref 22–32)
Calcium: 8.9 mg/dL (ref 8.9–10.3)
Chloride: 98 mmol/L (ref 98–111)
Creatinine, Ser: 0.54 mg/dL (ref 0.44–1.00)
GFR calc Af Amer: >60 mL/min (ref >60)
GFR calc non Af Amer: >60 mL/min (ref >60)
Glucose, Bld: 110 mg/dL — ABNORMAL HIGH (ref 70–99)
Potassium: 3.9 mmol/L (ref 3.5–5.1)
Sodium: 134 mmol/L — ABNORMAL LOW (ref 135–145)
Total Bilirubin: 0.6 mg/dL (ref 0.3–1.2)
Total Protein: 8.4 g/dL — ABNORMAL HIGH (ref 6.5–8.1)

## 2019-09-10 LAB — ETHANOL: Alcohol, Ethyl (B): 107 mg/dL — ABNORMAL HIGH (ref <10)

## 2019-09-10 MED ORDER — ONDANSETRON HCL 4 MG/2ML IJ SOLN
4.0000 mg | Freq: Once | INTRAMUSCULAR | Status: AC
  Administered 2019-09-10: 4 mg via INTRAVENOUS
  Filled 2019-09-10: qty 2

## 2019-09-10 MED ORDER — THIAMINE HCL 100 MG PO TABS
100.0000 mg | ORAL_TABLET | Freq: Every day | ORAL | 1 refills | Status: AC
Start: 2019-09-10 — End: ?

## 2019-09-10 MED ORDER — LEVETIRACETAM IN NACL 1000 MG/100ML IV SOLN
1000.0000 mg | Freq: Once | INTRAVENOUS | Status: AC
  Administered 2019-09-10: 1000 mg via INTRAVENOUS
  Filled 2019-09-10: qty 100

## 2019-09-10 MED ORDER — FOLIC ACID 1 MG PO TABS
1.0000 mg | ORAL_TABLET | Freq: Every day | ORAL | 1 refills | Status: AC
Start: 2019-09-10 — End: ?

## 2019-09-10 MED ORDER — SODIUM CHLORIDE 0.9 % IV BOLUS (SEPSIS)
1000.0000 mL | Freq: Once | INTRAVENOUS | Status: AC
  Administered 2019-09-10: 1000 mL via INTRAVENOUS

## 2019-09-10 MED ORDER — ONDANSETRON 4 MG PO TBDP
4.0000 mg | ORAL_TABLET | Freq: Once | ORAL | Status: AC
  Administered 2019-09-10: 4 mg via ORAL
  Filled 2019-09-10: qty 1

## 2019-09-10 MED ORDER — LEVETIRACETAM 500 MG PO TABS
500.0000 mg | ORAL_TABLET | Freq: Two times a day (BID) | ORAL | 1 refills | Status: AC
Start: 2019-09-10 — End: ?

## 2019-09-10 MED ORDER — IBUPROFEN 800 MG PO TABS
800.0000 mg | ORAL_TABLET | Freq: Once | ORAL | Status: AC
  Administered 2019-09-10: 800 mg via ORAL
  Filled 2019-09-10: qty 1

## 2019-09-10 MED ORDER — ONDANSETRON 4 MG PO TBDP: 4.0000 mg | ORAL_TABLET | Freq: Four times a day (QID) | ORAL | 0 refills | Status: AC | PRN

## 2019-09-10 MED ORDER — THIAMINE HCL 100 MG/ML IJ SOLN
100.0000 mg | Freq: Once | INTRAMUSCULAR | Status: AC
  Administered 2019-09-10: 100 mg via INTRAVENOUS
  Filled 2019-09-10: qty 2

## 2019-09-10 NOTE — ED Notes (Signed)
I asked pt if she thought that she could give a urine sample now, with her bolus complete. Pt states that she is still unable to urinate, and that she felt a little dizzy standing up for CT. Pt states that she will use the call bell when she is able to urinate.

## 2019-09-10 NOTE — ED Notes (Signed)
Patient ambulatory to BR for UA. No assistance needed.  

## 2019-09-10 NOTE — ED Triage Notes (Signed)
Patient states she started drinking again. Patient states she has been having a headache behind her eyes. Patient states that she thinks she had a seizure and hit her head.

## 2019-09-10 NOTE — Discharge Instructions (Addendum)
Your labs and head CT today were normal.  I recommend that you start taking seizure medication every day and follow-up with a neurologist as an outpatient.  You should not drive for at least 6 months after your last seizure.  You should not perform activities that would be dangerous to yourself or others if you were to have another seizure such as bathing alone, swimming, cooking alone, activity such as skydiving, rockclimbing, etc.  Do not operate machinery.

## 2019-09-10 NOTE — ED Notes (Signed)
Pt informed that we need a urine sample and states that she will ring the call bell when she is able to provide a sample

## 2019-09-10 NOTE — ED Provider Notes (Signed)
TIME SEEN: 4:55 AM  CHIEF COMPLAINT: "I think I had a seizure"  HPI: Female with history of presents to the emergency department with concerns that she had a seizure.  States this occurred at 10 PM last night.  States she was standing up and fell and had what she described as a generalized tonic-clonic seizure that was witnessed by her friend.  Describes a post ictal period afterwards and urinary incontinence but no tongue biting.  States she had a history of seizures as a child and was previously on Topamax but has not been on medications for years.  States she has had history of alcohol withdrawal seizures but does not think that that is what happened tonight because she states she has been drinking "a lot" of alcohol today.  No drug use.  States that she did hit her head is complaining of headache and nausea and vomiting.  She states that she thought she would get better with time but when the nausea and vomiting did not improve, she decided to come to the emergency department.  She is not on blood thinners.  No neck or back pain.  No numbness, tingling or focal weakness.  ROS: See HPI Constitutional: no fever  Eyes: no drainage  ENT: no runny nose   Cardiovascular:  no chest pain  Resp: no SOB  GI: no vomiting GU: no dysuria Integumentary: no rash  Allergy: no hives  Musculoskeletal: no leg swelling  Neurological: no slurred speech ROS otherwise negative  PAST MEDICAL HISTORY/PAST SURGICAL HISTORY:  History reviewed. No pertinent past medical history.  MEDICATIONS:  Prior to Admission medications   Medication Sig Start Date End Date Taking? Authorizing Provider  busPIRone (BUSPAR) 7.5 MG tablet Take 1 tablet (7.5 mg total) by mouth 2 (two) times daily. For anxiety 07/15/17   Money, Gerlene Burdock, FNP  cyclobenzaprine (FLEXERIL) 5 MG tablet Take 1 tablet (5 mg total) by mouth 3 (three) times daily as needed for muscle spasms. 07/15/17   Money, Gerlene Burdock, FNP  hydrOXYzine (ATARAX/VISTARIL) 25  MG tablet Take 1 tablet (25 mg total) by mouth 3 (three) times daily as needed for anxiety. 07/15/17   Money, Gerlene Burdock, FNP  ibuprofen (ADVIL) 600 MG tablet Take 1 tablet (600 mg total) by mouth every 6 (six) hours as needed. 03/18/19   Horton, Mayer Masker, MD  nitrofurantoin, macrocrystal-monohydrate, (MACROBID) 100 MG capsule Take 1 capsule (100 mg total) by mouth 2 (two) times daily. 03/18/19   Horton, Mayer Masker, MD  ondansetron (ZOFRAN ODT) 4 MG disintegrating tablet Take 1 tablet (4 mg total) by mouth every 8 (eight) hours as needed for nausea or vomiting. 05/05/19   Gailen Shelter, PA  oxyCODONE-acetaminophen (PERCOCET/ROXICET) 5-325 MG tablet Take 1 tablet by mouth every 8 (eight) hours as needed for severe pain. 08/15/19   McDonald, Mia A, PA-C  promethazine (PHENERGAN) 25 MG tablet Take 1 tablet (25 mg total) by mouth every 6 (six) hours as needed for nausea or vomiting. 08/15/19   McDonald, Mia A, PA-C  sertraline (ZOLOFT) 50 MG tablet Take 1 tablet (50 mg total) by mouth daily. For mood control 07/16/17   Money, Gerlene Burdock, FNP  traZODone (DESYREL) 50 MG tablet Take 1 tablet (50 mg total) by mouth at bedtime as needed for sleep. 07/15/17   Money, Gerlene Burdock, FNP    ALLERGIES:  No Known Allergies  SOCIAL HISTORY:  Social History   Tobacco Use  . Smoking status: Current Some Day Smoker  Packs/day: 0.50    Types: Cigarettes  . Smokeless tobacco: Never Used  Substance Use Topics  . Alcohol use: Not Currently    FAMILY HISTORY: Family History  Problem Relation Age of Onset  . Cancer Mother   . Diabetes Father     EXAM: BP 119/78 (BP Location: Left Arm)   Pulse (!) 111   Temp 98.4 F (36.9 C) (Oral)   Resp 19   Ht 5\' 8"  (1.727 m)   Wt (!) 140.6 kg   LMP 09/03/2019   SpO2 95%   BMI 47.14 kg/m  CONSTITUTIONAL: Alert and oriented and responds appropriately to questions. Well-appearing; well-nourished; GCS 15 HEAD: Normocephalic; atraumatic EYES: Conjunctivae clear, PERRL,  EOMI ENT: normal nose; no rhinorrhea; moist mucous membranes; pharynx without lesions noted; no dental injury; no septal hematoma NECK: Supple, no meningismus, no LAD; no midline spinal tenderness, step-off or deformity; trachea midline CARD: RRR; S1 and S2 appreciated; no murmurs, no clicks, no rubs, no gallops RESP: Normal chest excursion without splinting or tachypnea; breath sounds clear and equal bilaterally; no wheezes, no rhonchi, no rales; no hypoxia or respiratory distress CHEST:  chest wall stable, no crepitus or ecchymosis or deformity, nontender to palpation; no flail chest ABD/GI: Normal bowel sounds; non-distended; soft, non-tender, no rebound, no guarding; no ecchymosis or other lesions noted PELVIS:  stable, nontender to palpation BACK:  The back appears normal and is non-tender to palpation, there is no CVA tenderness; no midline spinal tenderness, step-off or deformity EXT: Normal ROM in all joints; non-tender to palpation; no edema; normal capillary refill; no cyanosis, no bony tenderness or bony deformity of patient's extremities, no joint effusion, compartments are soft, extremities are warm and well-perfused, no ecchymosis SKIN: Normal color for age and race; warm NEURO: Moves all extremities equally, sensation to light touch intact diffusely, cranial nerves II through XII intact, normal speech, no tremors, no hallucinations PSYCH: The patient's mood and manner are appropriate. Grooming and personal hygiene are appropriate.  MEDICAL DECISION MAKING: Patient here after seizure.  Low suspicion that this was a withdrawal seizure.  Initially tachycardic but now heart rate is in the 90s.  She appears slightly intoxicated but not in withdrawal.  Will give IV fluids, thiamine, Zofran.  Will obtain CT of the head to rule out intracranial injury.  Will obtain labs, urine and start patient on Keppra given she has previous history of childhood epilepsy.  Discussed with patient that I  recommend follow-up with neurology as an outpatient if work-up today is unremarkable.  She is comfortable with this plan.  No sign of trauma on exam today.  She is currently neurologically intact and hemodynamically stable.  Afebrile, nontoxic-appearing.  ED PROGRESS: Labs unremarkable other than mildly elevated liver function tests which is to be expected in the setting of alcohol abuse.  Alcohol level minimally elevated.  She has not had any sign of withdrawal here.  She has not been vomiting, tachycardic, hypertensive, tremulous, hallucinating.  Will give outpatient resources and discharged on thiamine and folic acid.  Urine shows no sign of infection.  Recommended close neurology follow-up given history of epilepsy as a child and seizure today without signs of alcohol withdrawal.  Will discharge with Keppra.  Patient verbalized understanding.  Her head CT showed no acute abnormality.   At this time, I do not feel there is any life-threatening condition present. I have reviewed, interpreted and discussed all results (EKG, imaging, lab, urine as appropriate) and exam findings with patient/family. I have  reviewed nursing notes and appropriate previous records.  I feel the patient is safe to be discharged home without further emergent workup and can continue workup as an outpatient as needed. Discussed usual and customary return precautions. Patient/family verbalize understanding and are comfortable with this plan.  Outpatient follow-up has been provided as needed. All questions have been answered.  At this time, I do not feel there is any life-threatening condition present. I have reviewed, interpreted and discussed all results (EKG, imaging, lab, urine as appropriate) and exam findings with patient/family. I have reviewed nursing notes and appropriate previous records.  I feel the patient is safe to be discharged home without further emergent workup and can continue workup as an outpatient as needed.  Discussed usual and customary return precautions. Patient/family verbalize understanding and are comfortable with this plan.  Outpatient follow-up has been provided as needed. All questions have been answered.    EKG Interpretation  Date/Time:  Thursday September 10 2019 05:06:04 EDT Ventricular Rate:  97 PR Interval:    QRS Duration: 89 QT Interval:  367 QTC Calculation: 467 R Axis:   27 Text Interpretation: Sinus rhythm No significant change since last tracing Confirmed by Pryor Curia [phone]) on 09/10/2019 5:20:45 AM        Scot Jun was evaluated in Emergency Department on 09/10/2019 for the symptoms described in the history of present illness. She was evaluated in the context of the global COVID-19 pandemic, which necessitated consideration that the patient might be at risk for infection with the SARS-CoV-2 virus that causes COVID-19. Institutional protocols and algorithms that pertain to the evaluation of patients at risk for COVID-19 are in a state of rapid change based on information released by regulatory bodies including the CDC and federal and state organizations. These policies and algorithms were followed during the patient's care in the ED.       Jenalee Trevizo, Delice Bison, DO 09/10/19 [phone]

## 2020-08-06 IMAGING — CT CT HEAD W/O CM
3 series · 16 of 47 positions shown, 19 images · non-contrast
Comparison: None available

CLINICAL DATA: Headache and possible seizure.

EXAM:
CT HEAD WITHOUT CONTRAST
TECHNIQUE: Contiguous axial images were obtained from the base of the skull
through the vertex without intravenous contrast.

[Series 2: head wo · axial · 0.47mm/px · z∈[-158,-28]mm · 10 of 32 slices shown, 13 images]
[im 3/32  brain]
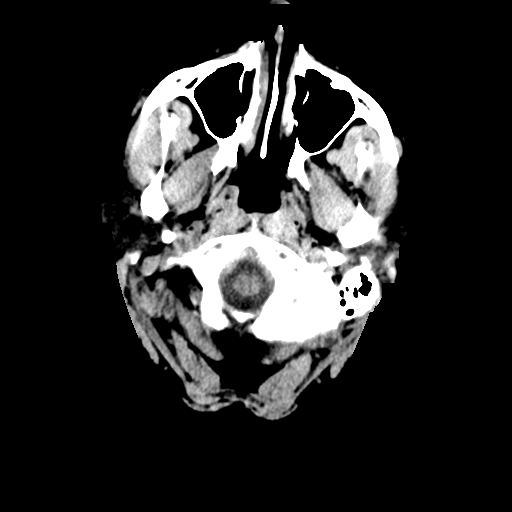
[im 3/32  bone]
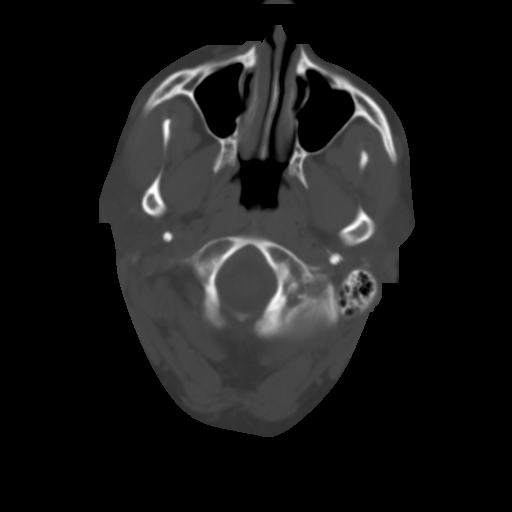
[im 6/32  brain]
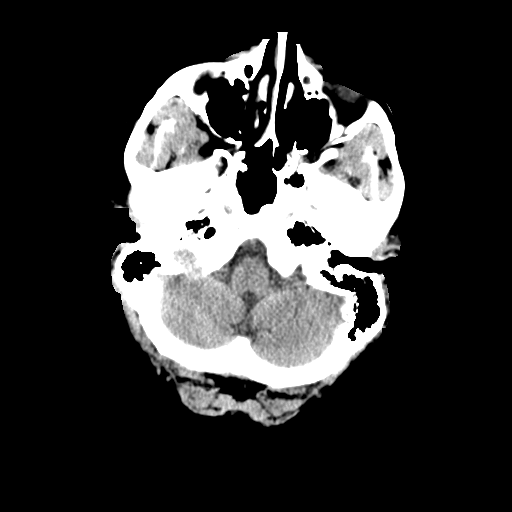
[im 9/32  brain]
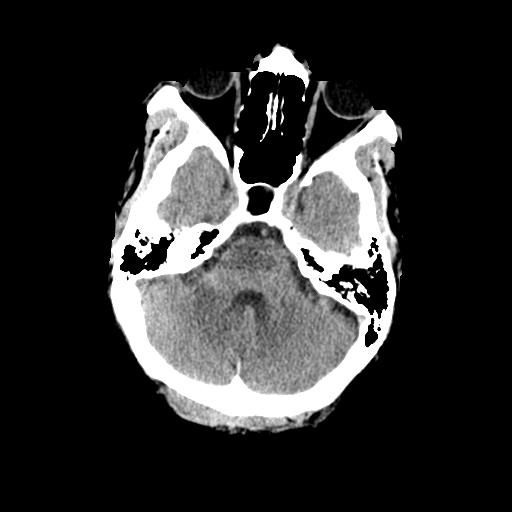
[im 11/32  brain]
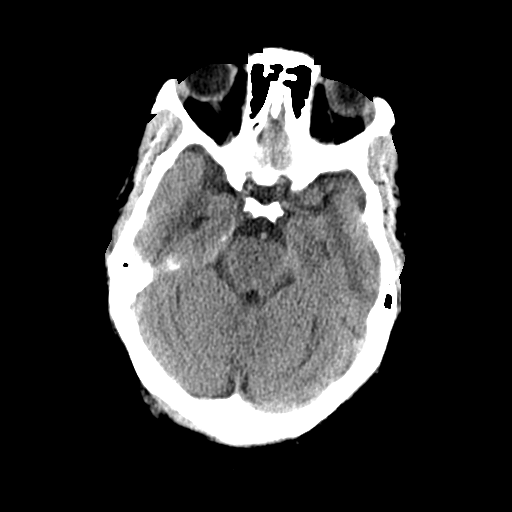
[im 14/32  brain]
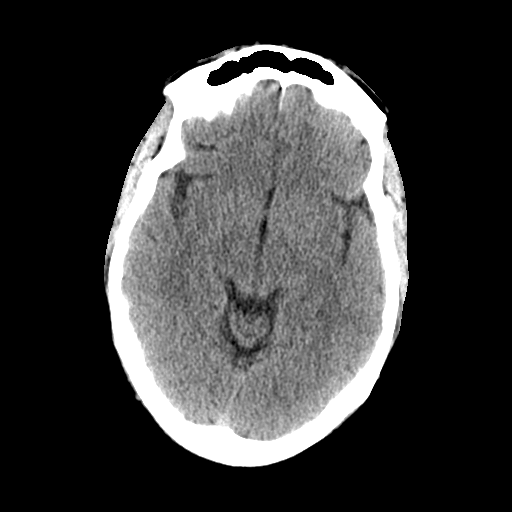
[im 14/32  bone]
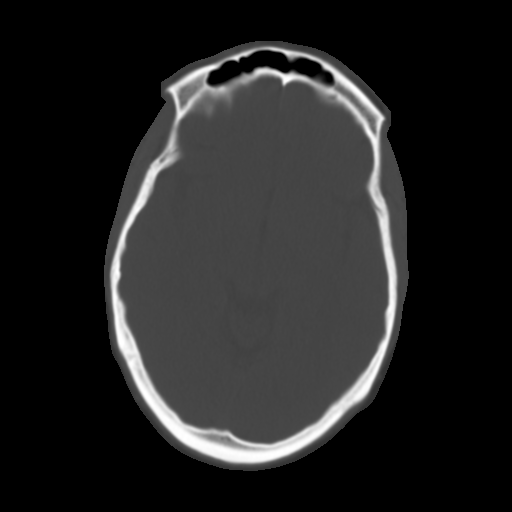
[im 18/32  brain]
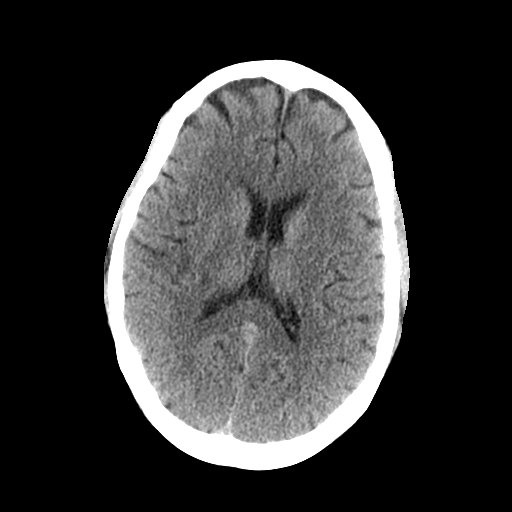
[im 21/32  brain]
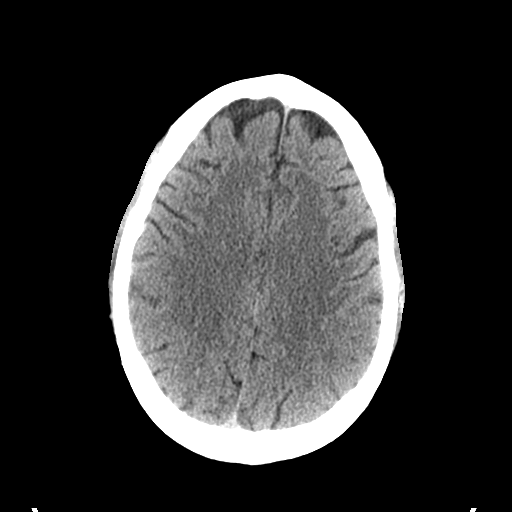
[im 24/32  brain]
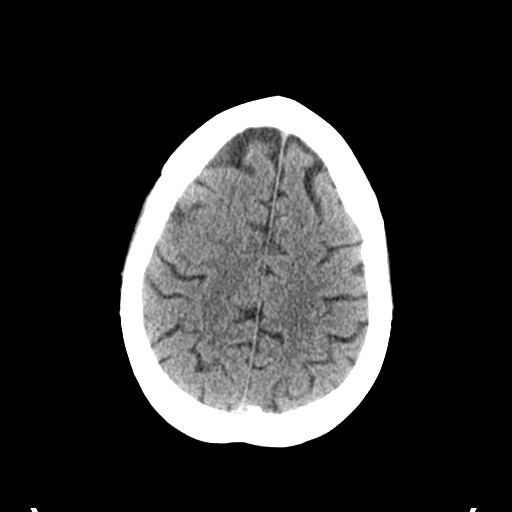
[im 26/32  brain]
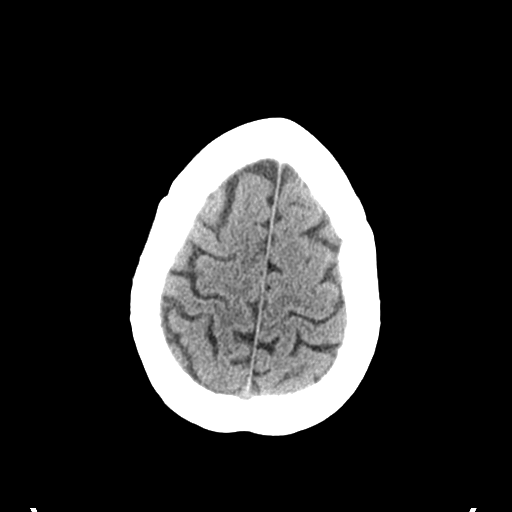
[im 26/32  bone]
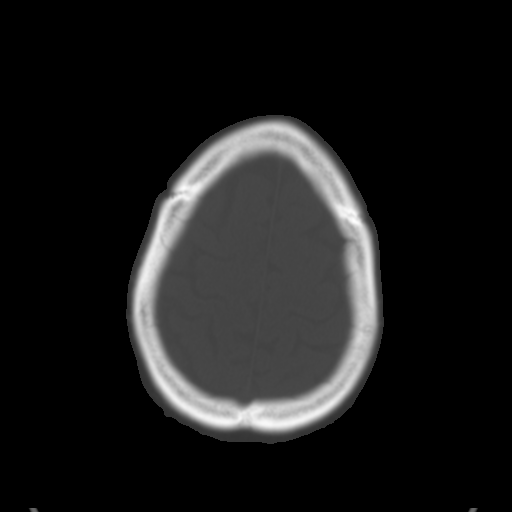
[im 29/32  brain]
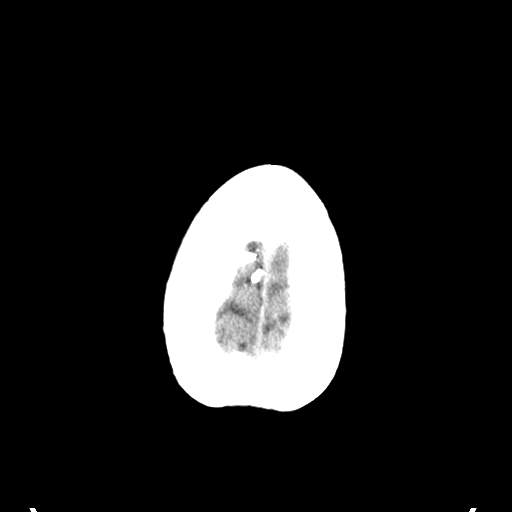

[Series 4: coronal soft tissue · coronal · 0.30mm/px · 3 of 70 slices shown]
[im 24/70  brain]
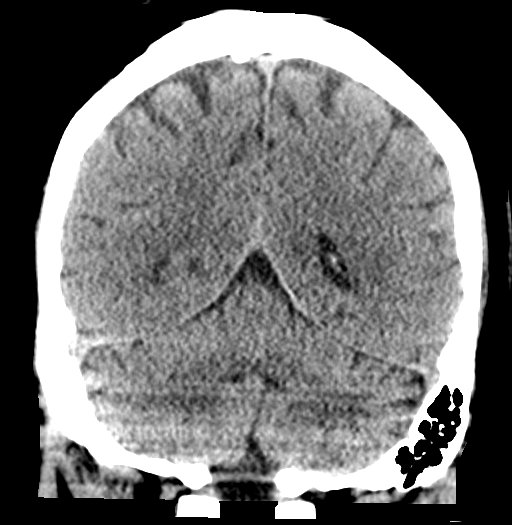
[im 31/70  brain]
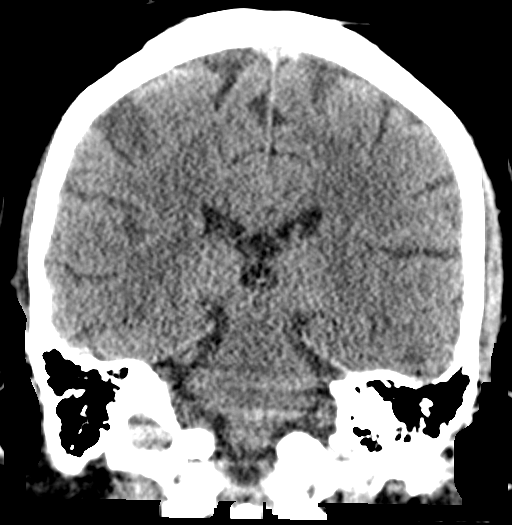
[im 39/70  brain]
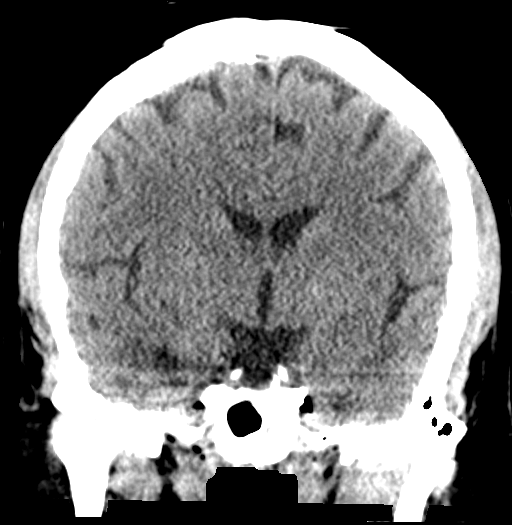

[Series 5: sagittal soft tissue · sagittal · 0.31mm/px · 3 of 50 slices shown]
[im 17/50  brain]
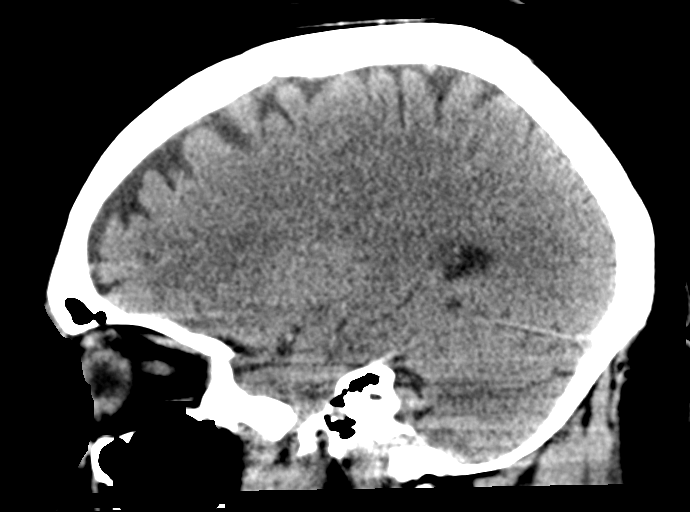
[im 25/50  brain]
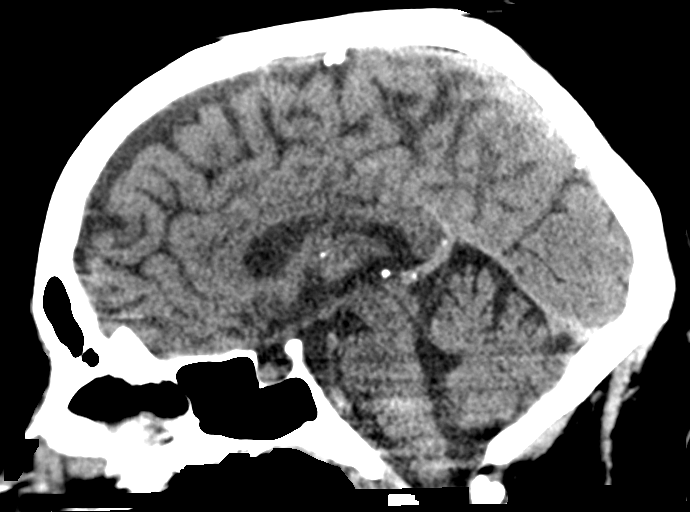
[im 33/50  brain]
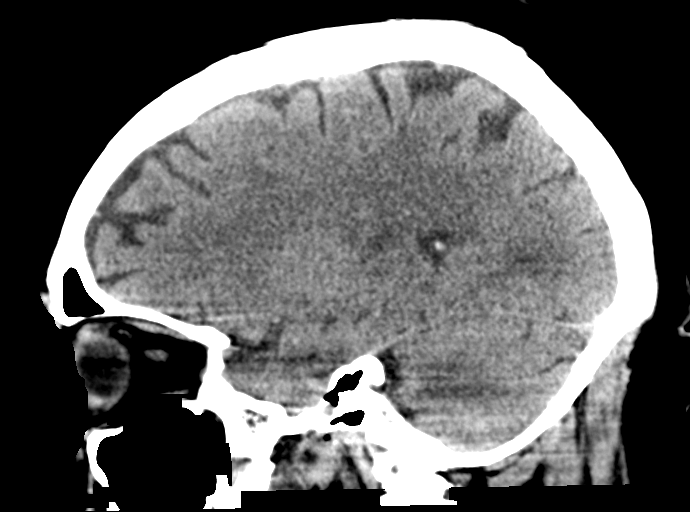

[16 of 47 positions shown; findings below may reference images not displayed]

FINDINGS: Brain: No evidence of acute infarction, hemorrhage, hydrocephalus,
extra-axial collection or mass lesion/mass effect.

Vascular: No hyperdense vessel or unexpected calcification.

Skull: Normal. Negative for fracture or focal lesion.

Sinuses/Orbits: No acute finding.
IMPRESSION: Negative head CT.
